# Patient Record
Sex: Female | Born: 1960 | Race: White | Hispanic: No | Marital: Married | Smoking: Never smoker
Health system: Southern US, Community
[De-identification: ages and names within clinical notes are randomized; demographics above are authoritative.]

---

## 2007-09-08 ENCOUNTER — Ambulatory Visit: Payer: Self-pay | Admitting: Family Medicine

## 2007-10-10 ENCOUNTER — Ambulatory Visit: Payer: Self-pay | Admitting: Family Medicine

## 2008-02-11 ENCOUNTER — Ambulatory Visit: Payer: Self-pay | Admitting: Family Medicine

## 2009-02-11 IMAGING — CR DG CHEST 2V
1 series · 2 of 2 positions shown · non-contrast
Comparison: none

REASON FOR EXAM: cough with fever
COMMENTS:

PROCEDURE:     MDR - MDR CHEST PA(OR AP) AND LATERAL  - October 10, 2007  [DATE]
RESULT:     The lung fields are clear. The heart, mediastinal and osseous
structures show no acute changes. The chest appears hyperexpanded,
suspicious for a history of COPD or asthma.

[Series 1: view not recorded · 0.17mm/px · 2 of 2 slices shown]
[im 1/2]
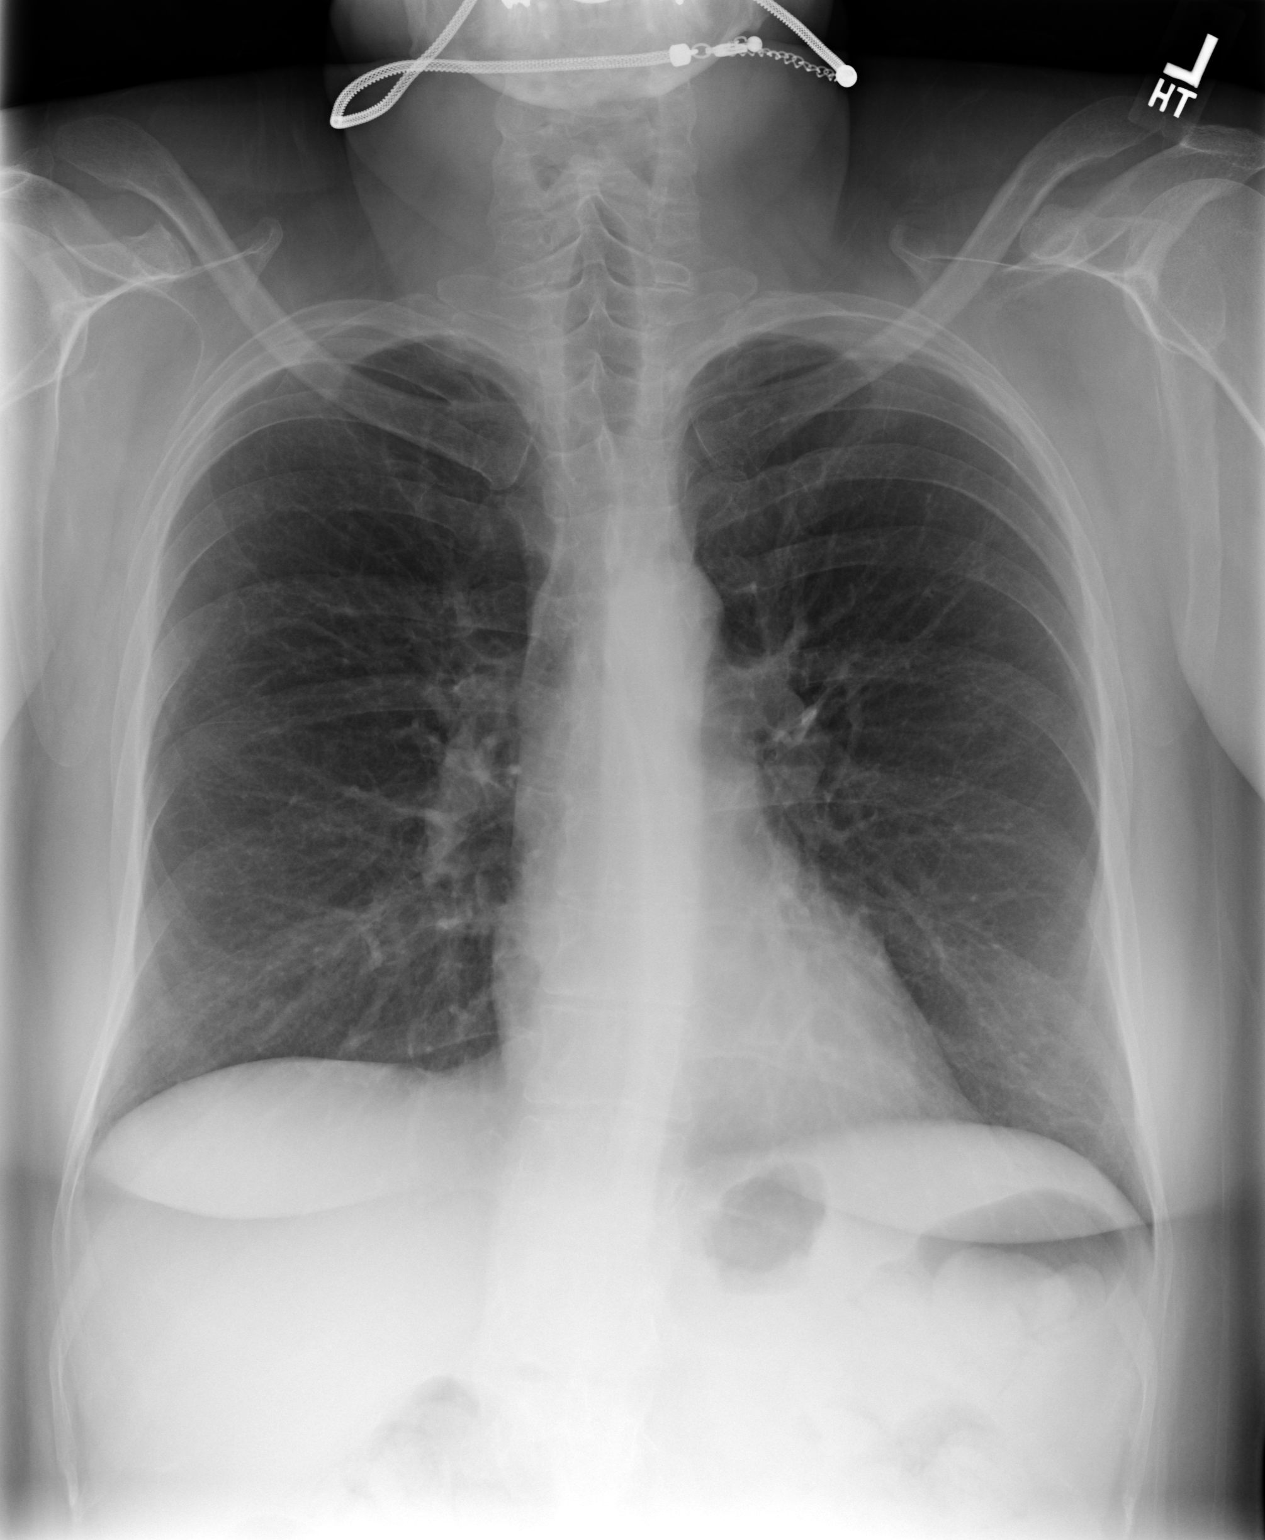
[im 2/2]
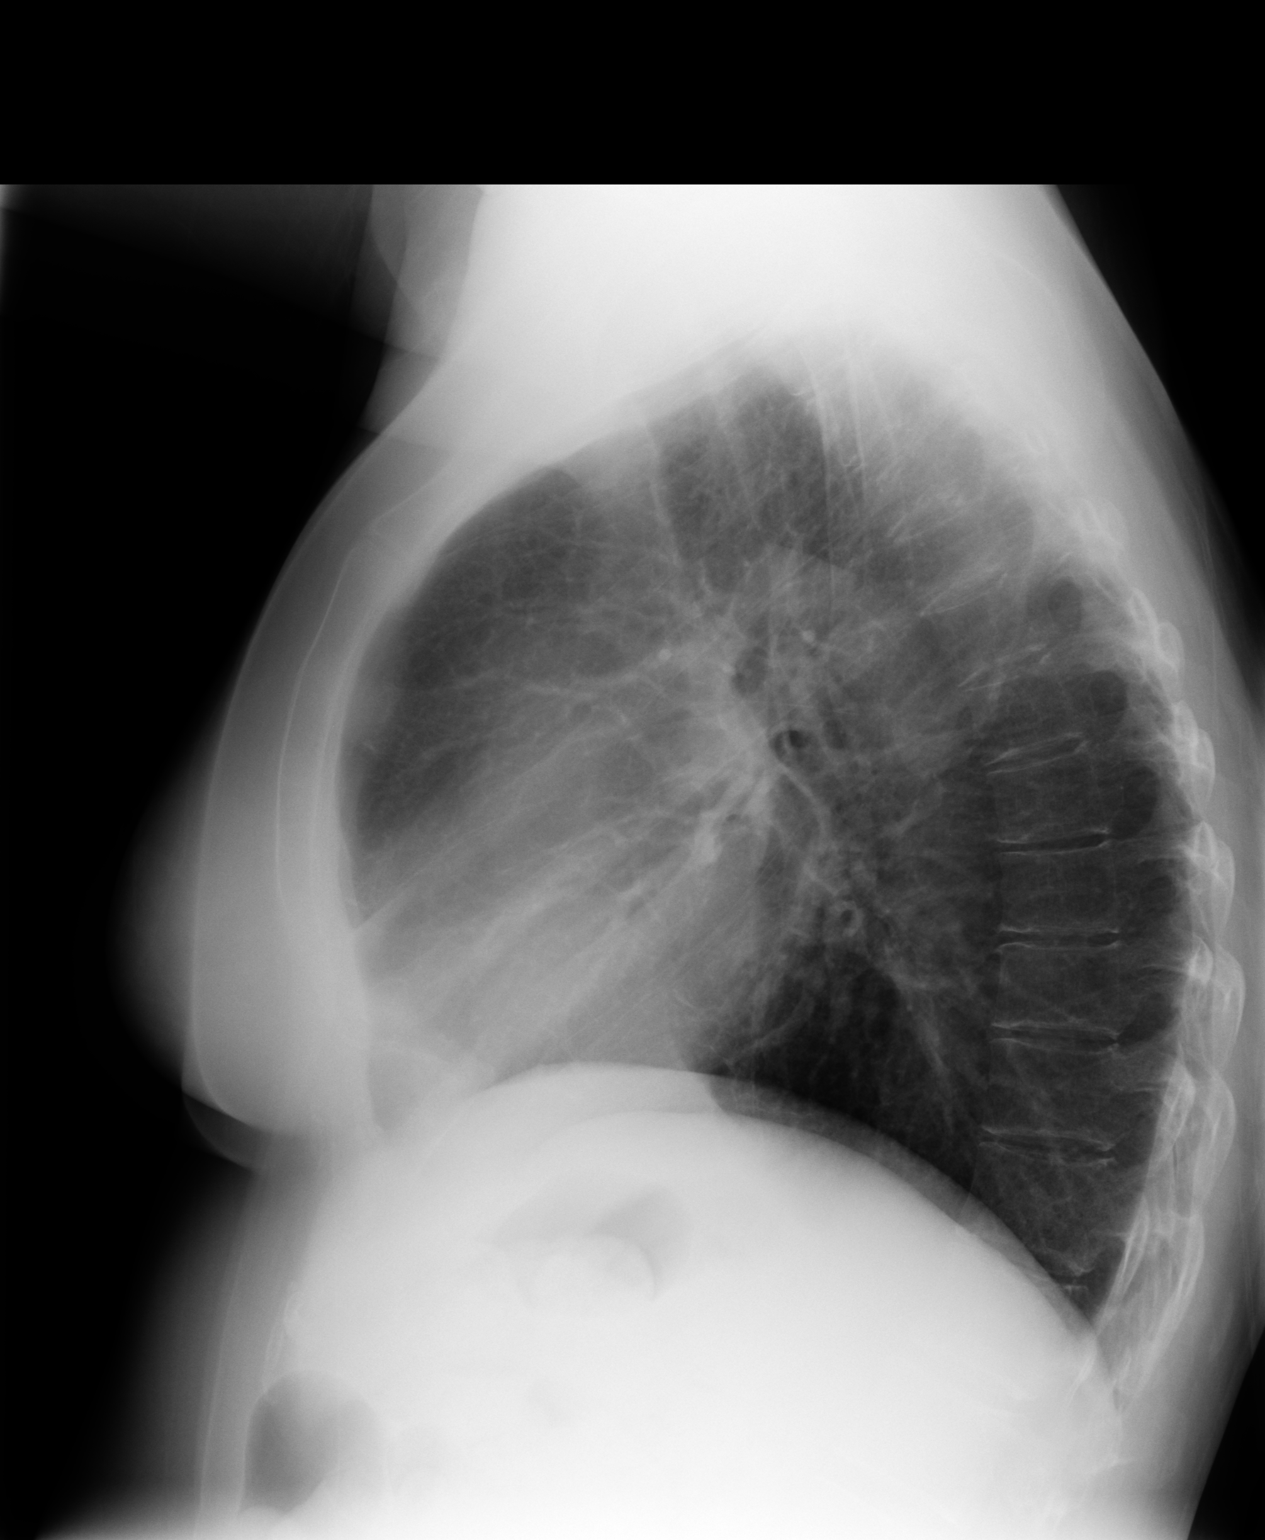

[2 of 2 positions shown; findings below may reference images not displayed]

IMPRESSION: 1. The lung fields are clear.
2. The chest appears mildly hyperexpanded.

## 2009-06-15 IMAGING — CT CT ABDOMEN W/ CM
1 of 4 series · 13 of 32 positions shown, 19 images · non-contrast
Comparison: none

REASON FOR EXAM: fatty infiltration of liver
COMMENTS:

[Series 2: soft tissue · axial · 0.69mm/px · z∈[-292,-66]mm · 13 of 53 slices shown, 19 images]
[im 4/53  soft-tissue]
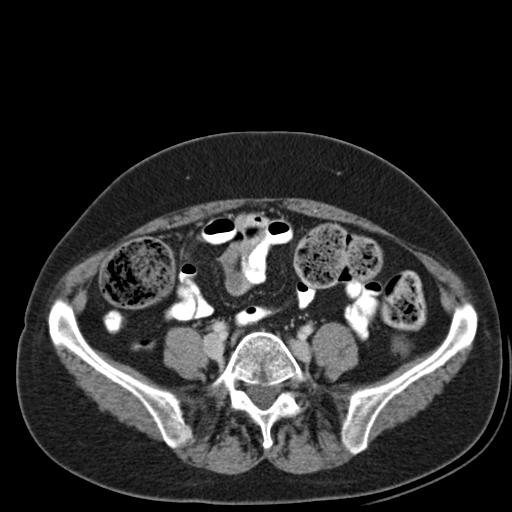
[im 4/53  bone]
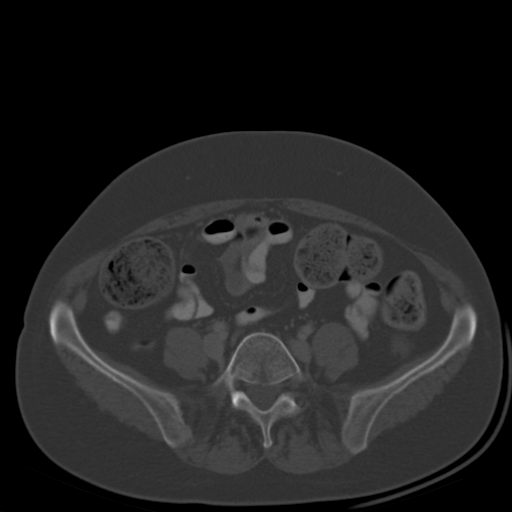
[im 8/53  soft-tissue]
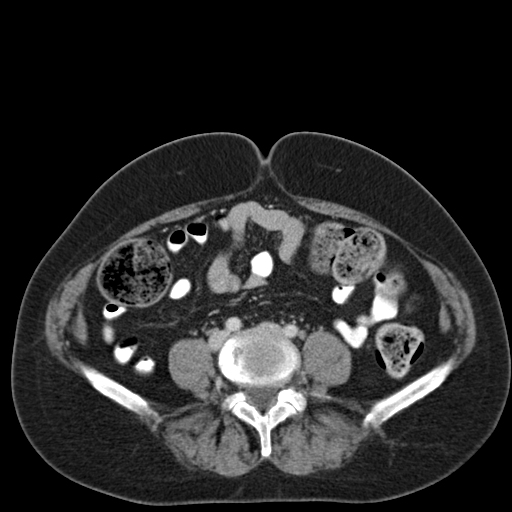
[im 12/53  soft-tissue]
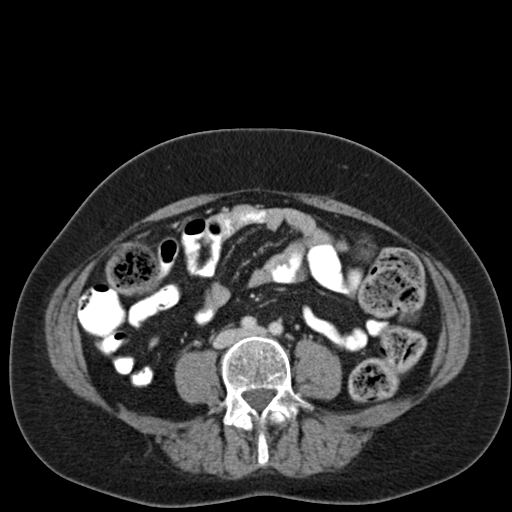
[im 15/53  soft-tissue]
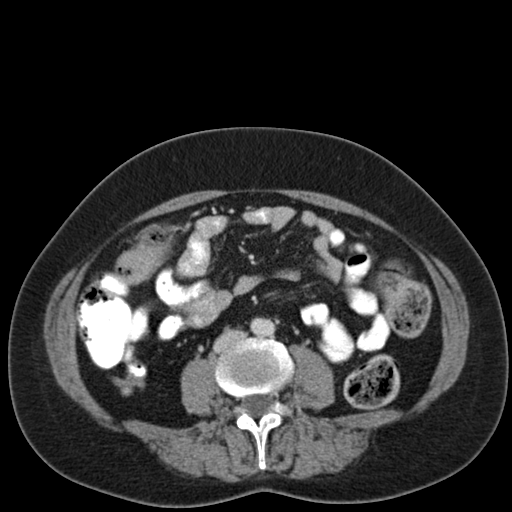
[im 19/53  soft-tissue]
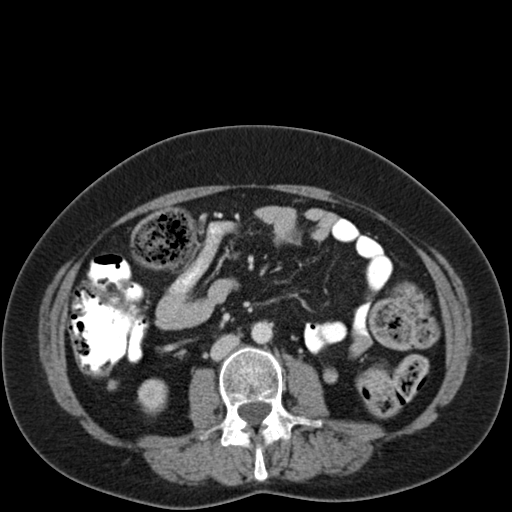
[im 23/53  soft-tissue]
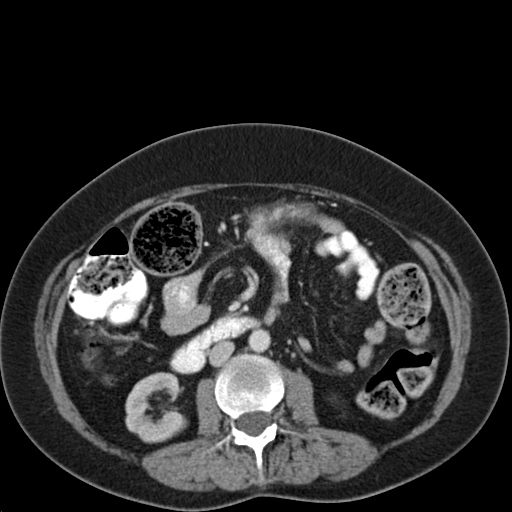
[im 27/53  soft-tissue]
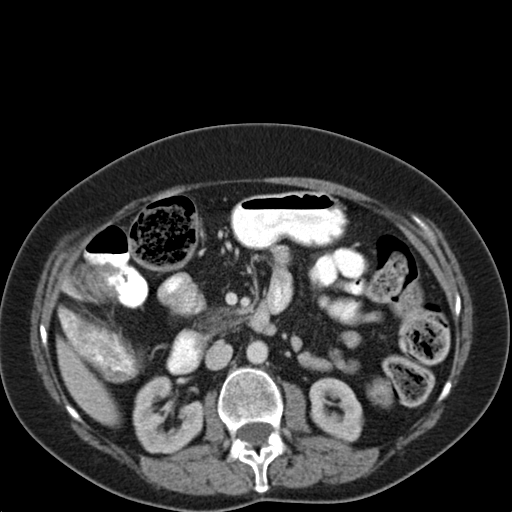
[im 30/53  soft-tissue]
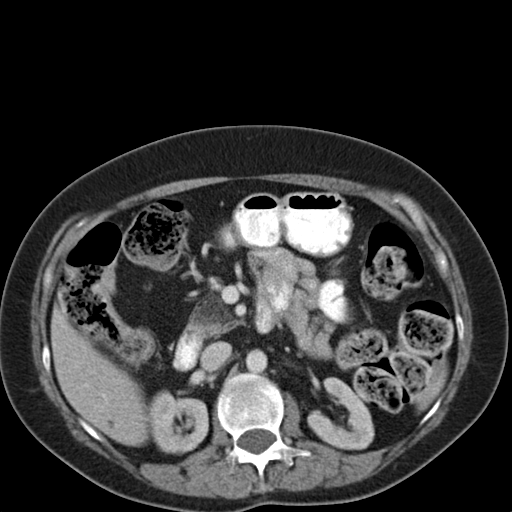
[im 34/53  soft-tissue]
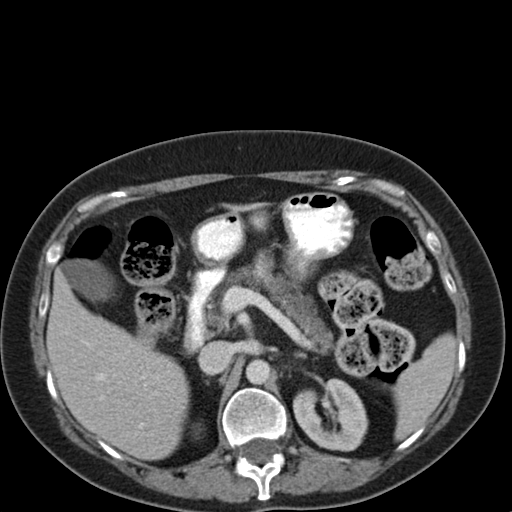
[im 34/53  bone]
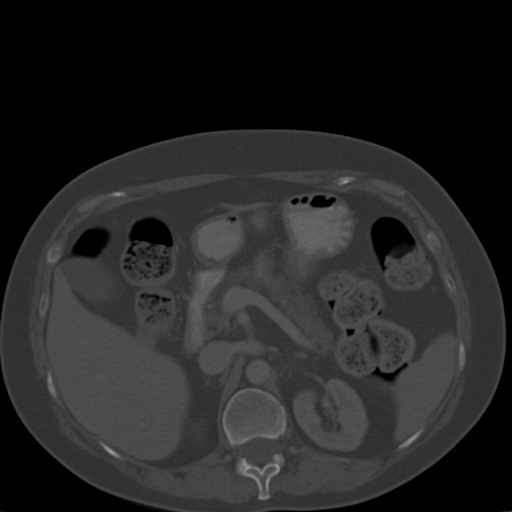
[im 38/53  soft-tissue]
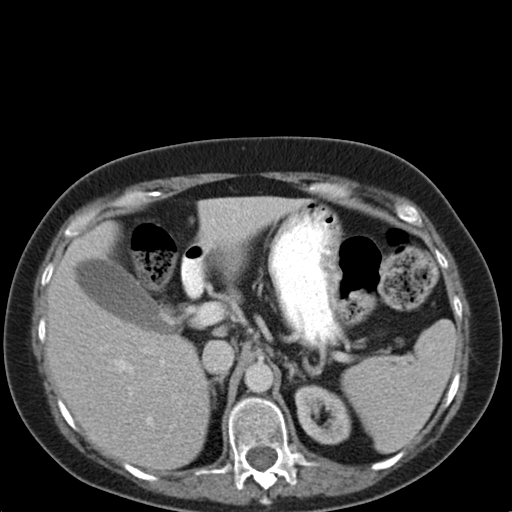
[im 38/53  lung]
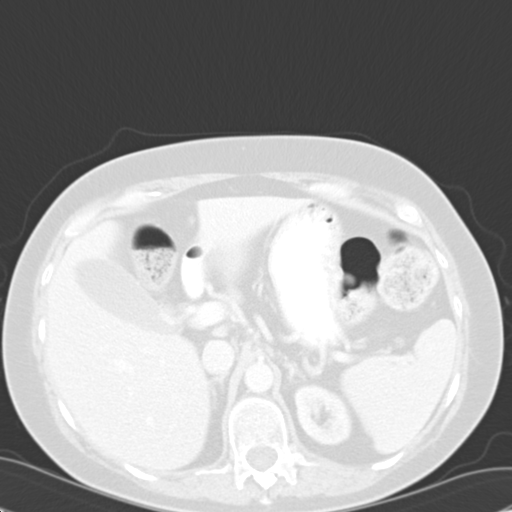
[im 41/53  soft-tissue]
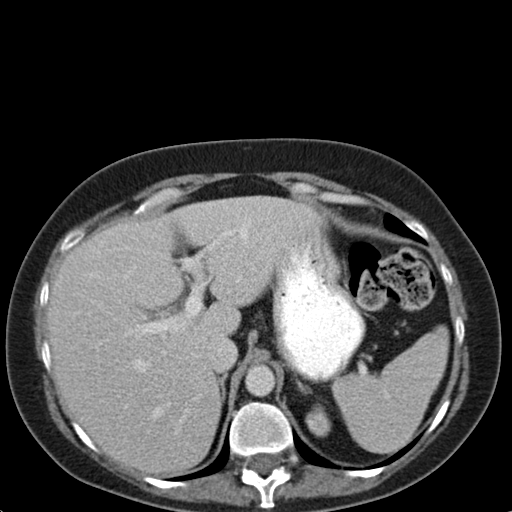
[im 41/53  lung]
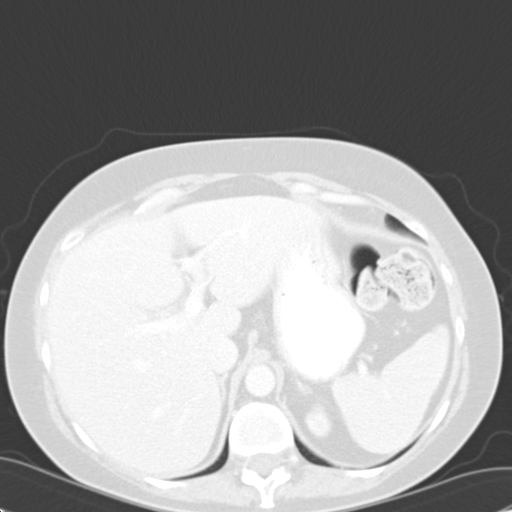
[im 45/53  soft-tissue]
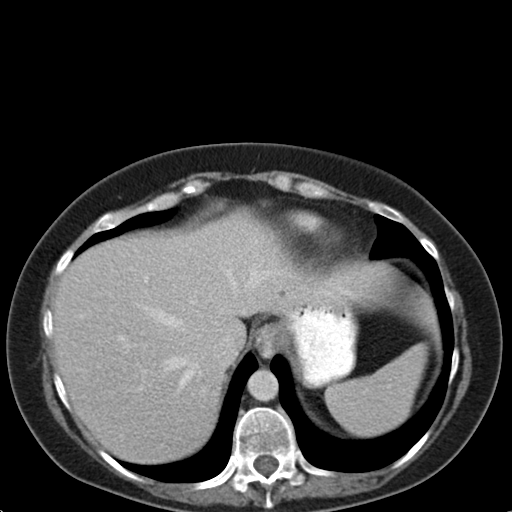
[im 45/53  lung]
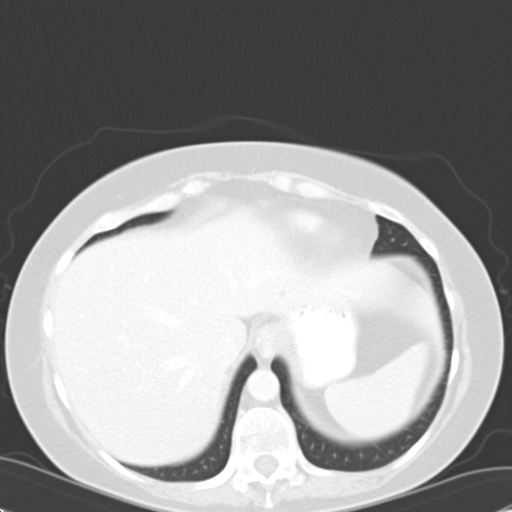
[im 49/53  soft-tissue]
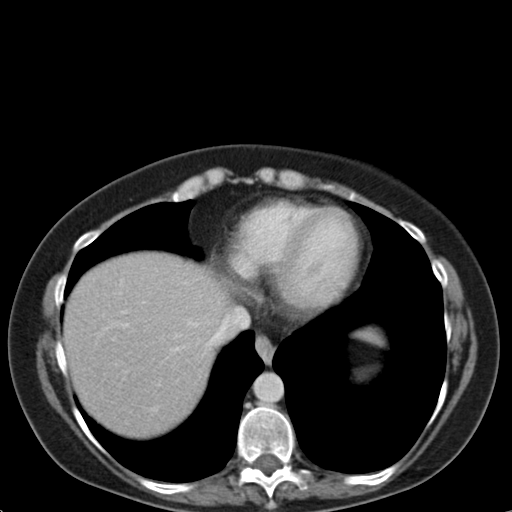
[im 49/53  lung]
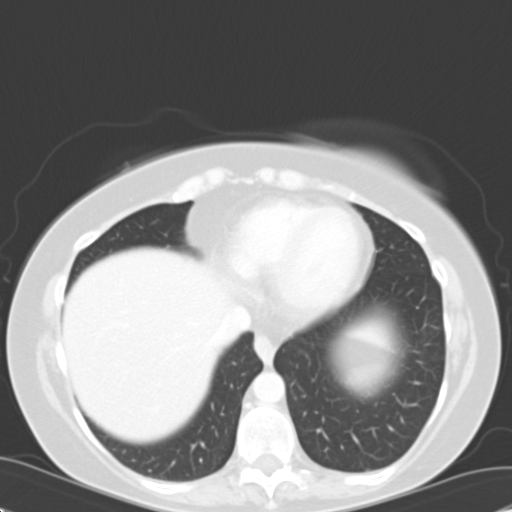

[13 of 32 positions shown; findings below may reference images not displayed]

PROCEDURE:     CT  - CT ABDOMEN STANDARD W  - February 11, 2008  [DATE]

RESULT:     CT of the abdomen is performed utilizing oral contrast and
approximately 80 ml of [id number] iodinated intravenous contrast. There is
no previous exam for comparison.

There does not appear to be evidence of fatty infiltration of the liver. The
gallbladder is unremarkable. The spleen is within normal limits. The adrenal
glands and kidneys appear normal. The abdominal aorta is unremarkable. There
is no abnormal bowel distention or bowel wall thickening.

The pancreatic head region shows decreased attenuation within the pancreas
with ill-defined margins suggestive of pancreatitis. Clinical and laboratory
correlation is recommended. A discrete mass is not evident. Images through
the base of the lungs show no gross abnormality.
IMPRESSION: Findings suggestive of pancreatitis predominantly in the
pancreatic head region. Clinical and laboratory correlation and followup is
recommended.

## 2009-12-01 ENCOUNTER — Ambulatory Visit: Payer: Self-pay | Admitting: Internal Medicine

## 2011-04-05 IMAGING — CR DG KNEE COMPLETE 4+V*R*
1 series · 4 of 4 positions shown · non-contrast
Comparison: none

REASON FOR EXAM: pain swelling
COMMENTS:   LMP: Post-Menopausal

PROCEDURE:     MDR - MDR KNEE RT COMPLETE W/OBLIQUES  - December 01, 2009  [DATE]
RESULT:     Comparison:  None

[Series 1: view not recorded · 0.17mm/px · 4 of 4 slices shown]
[im 1/4]
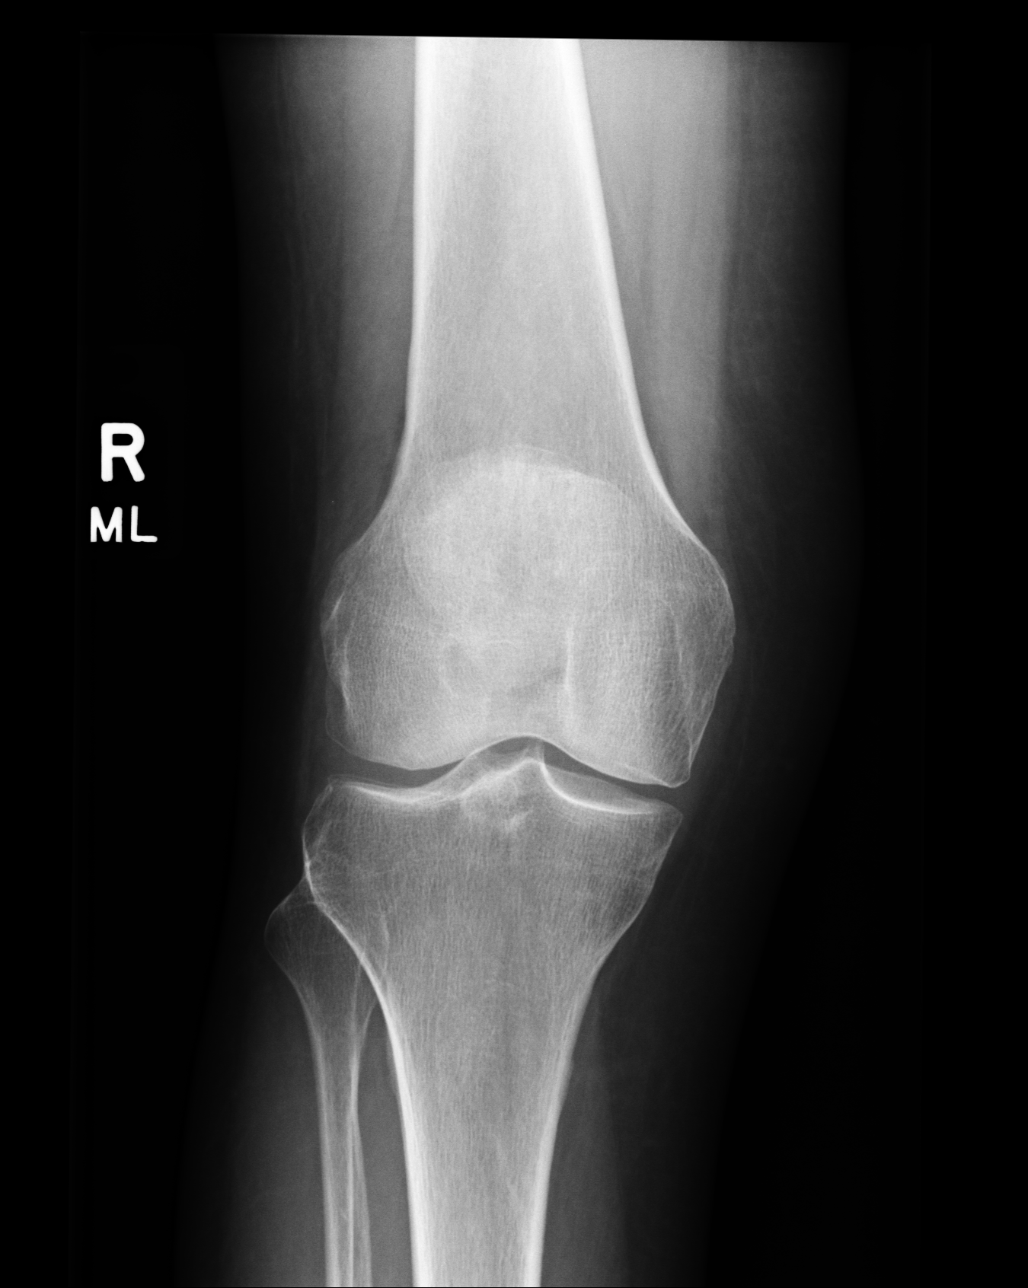
[im 2/4]
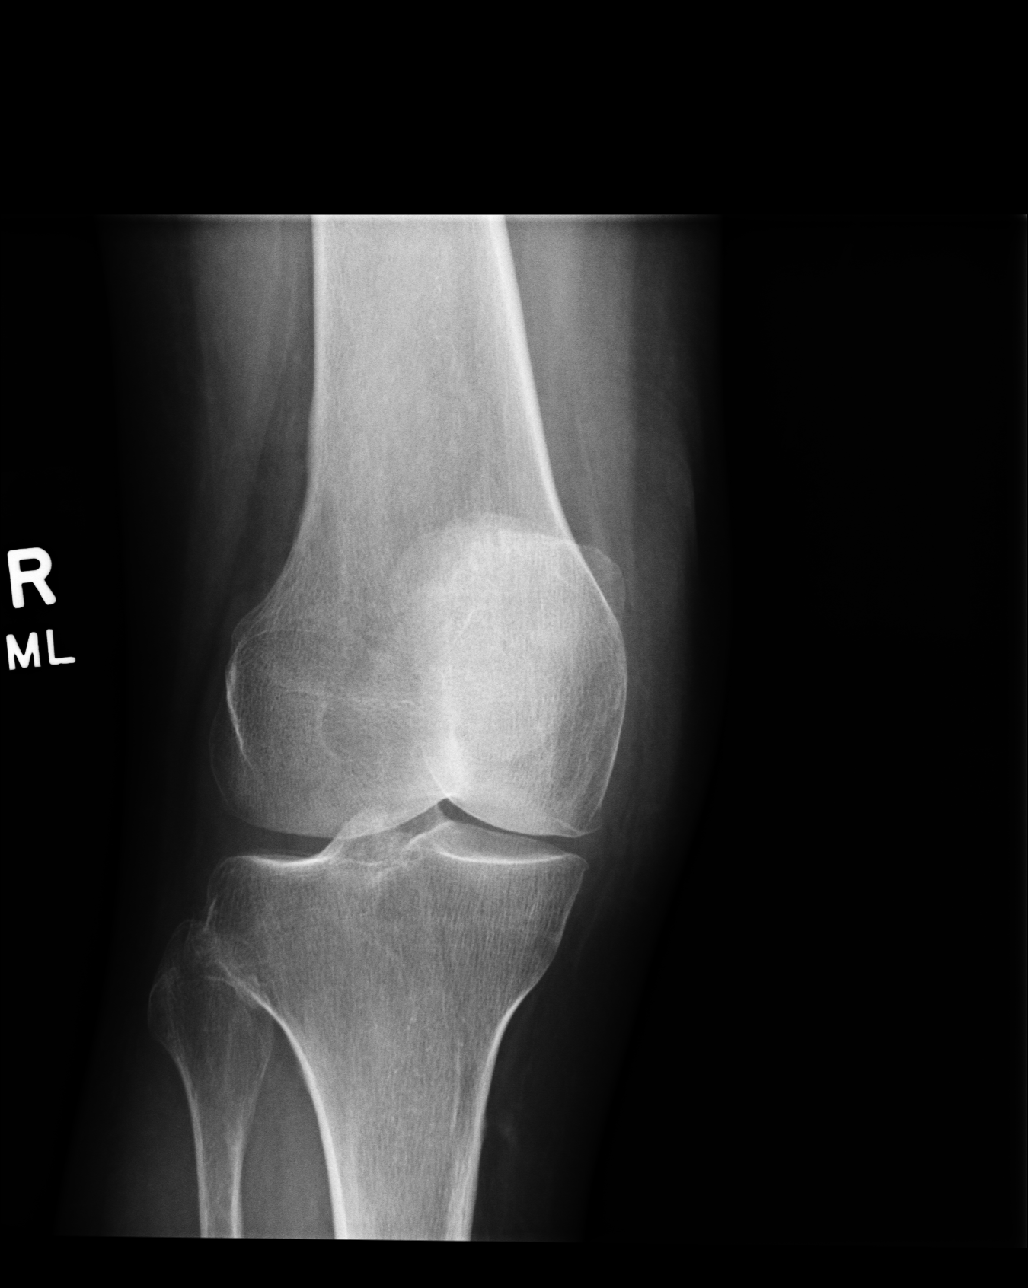
[im 3/4]
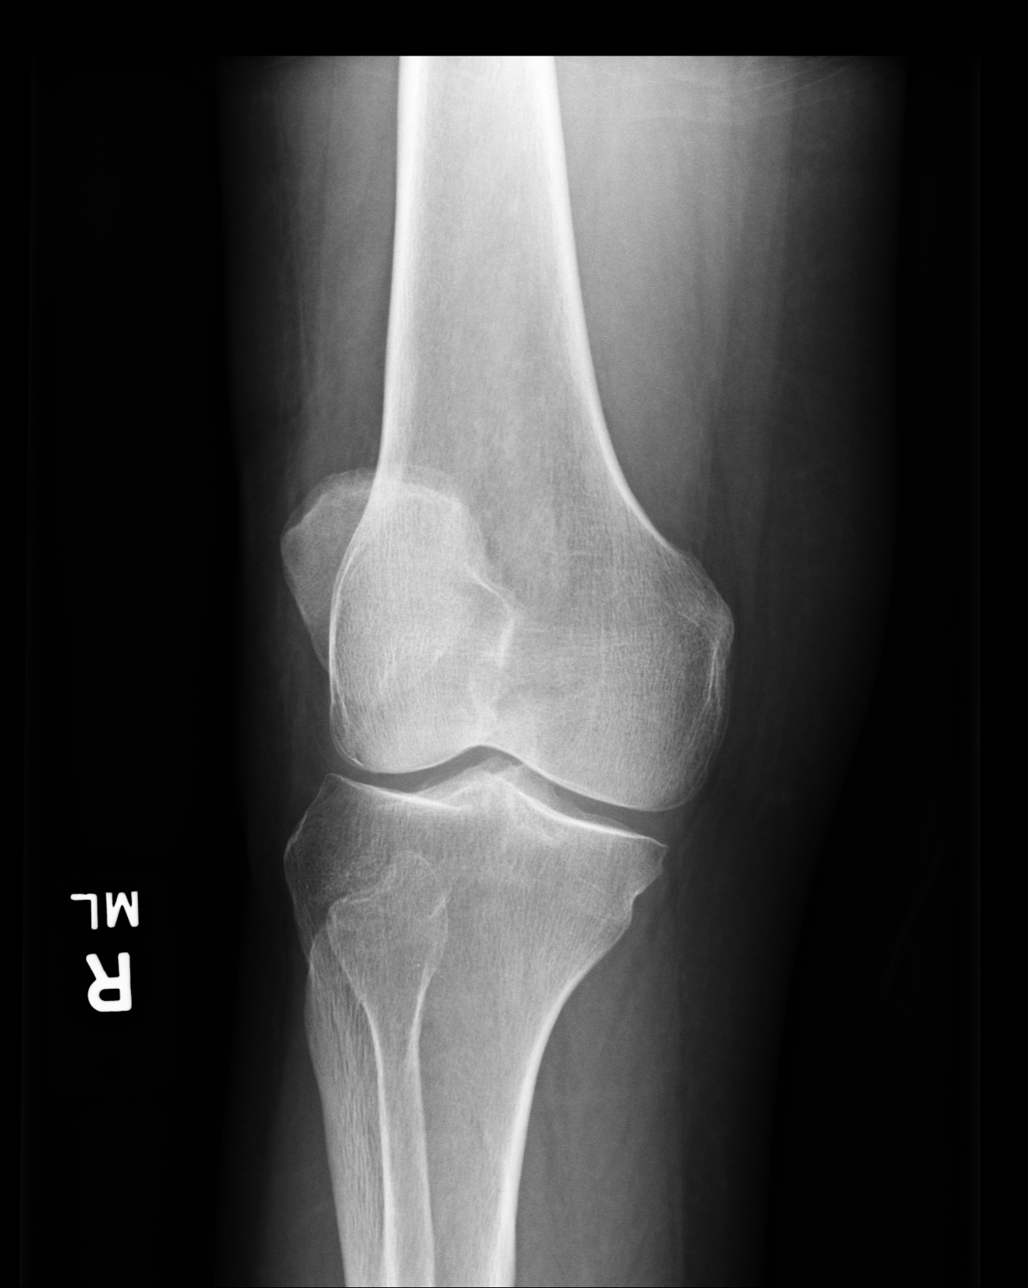
[im 4/4]
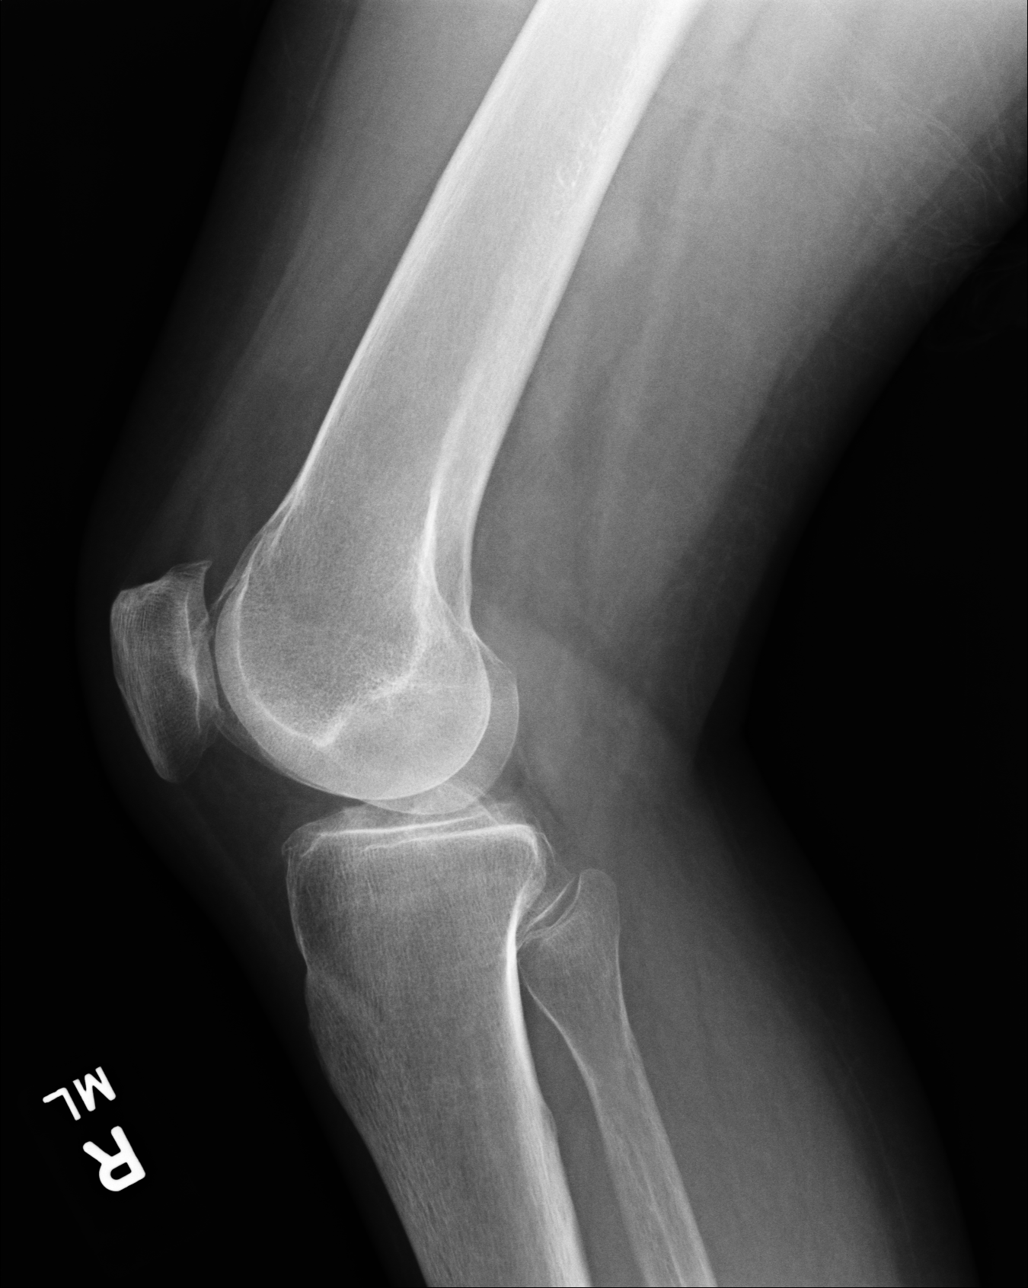

[4 of 4 positions shown; findings below may reference images not displayed]

FINDINGS: 4 views of the right knee demonstrates no acute fracture or dislocation.
There is no significant joint effusion.
IMPRESSION: No acute osseous injury of the right knee.

## 2016-11-22 ENCOUNTER — Other Ambulatory Visit: Payer: Self-pay | Admitting: Hematology and Oncology

## 2016-11-22 DIAGNOSIS — Z1239 Encounter for other screening for malignant neoplasm of breast: Secondary | ICD-10-CM

## 2017-02-03 ENCOUNTER — Encounter: Payer: Self-pay | Admitting: Emergency Medicine

## 2017-02-03 DIAGNOSIS — Z880 Allergy status to penicillin: Secondary | ICD-10-CM | POA: Insufficient documentation

## 2017-02-03 DIAGNOSIS — E860 Dehydration: Secondary | ICD-10-CM | POA: Insufficient documentation

## 2017-02-03 DIAGNOSIS — Z79899 Other long term (current) drug therapy: Secondary | ICD-10-CM | POA: Insufficient documentation

## 2017-02-03 DIAGNOSIS — N390 Urinary tract infection, site not specified: Secondary | ICD-10-CM | POA: Insufficient documentation

## 2017-02-03 DIAGNOSIS — K8000 Calculus of gallbladder with acute cholecystitis without obstruction: Principal | ICD-10-CM | POA: Insufficient documentation

## 2017-02-03 DIAGNOSIS — R112 Nausea with vomiting, unspecified: Secondary | ICD-10-CM | POA: Insufficient documentation

## 2017-02-03 DIAGNOSIS — R1011 Right upper quadrant pain: Secondary | ICD-10-CM | POA: Insufficient documentation

## 2017-02-03 LAB — COMPREHENSIVE METABOLIC PANEL
ALK PHOS: 121 U/L (ref 38–126)
ALT: 32 U/L (ref 14–54)
ANION GAP: 10 (ref 5–15)
AST: 97 U/L — ABNORMAL HIGH (ref 15–41)
Albumin: 4.4 g/dL (ref 3.5–5.0)
BUN: 12 mg/dL (ref 6–20)
CALCIUM: 9.2 mg/dL (ref 8.9–10.3)
CHLORIDE: 101 mmol/L (ref 101–111)
CO2: 30 mmol/L (ref 22–32)
Creatinine, Ser: 1.19 mg/dL — ABNORMAL HIGH (ref 0.44–1.00)
GFR calc non Af Amer: 50 mL/min — ABNORMAL LOW (ref >60)
GFR, EST AFRICAN AMERICAN: 58 mL/min — AB (ref >60)
Glucose, Bld: 140 mg/dL — ABNORMAL HIGH (ref 65–99)
Potassium: 2.8 mmol/L — ABNORMAL LOW (ref 3.5–5.1)
SODIUM: 141 mmol/L (ref 135–145)
Total Bilirubin: 0.4 mg/dL (ref 0.3–1.2)
Total Protein: 8.3 g/dL — ABNORMAL HIGH (ref 6.5–8.1)

## 2017-02-03 LAB — CBC
HCT: 43.4 % (ref 35.0–47.0)
HEMOGLOBIN: 14.1 g/dL (ref 12.0–16.0)
MCH: 27.8 pg (ref 26.0–34.0)
MCHC: 32.5 g/dL (ref 32.0–36.0)
MCV: 85.7 fL (ref 80.0–100.0)
Platelets: 310 10*3/uL (ref 150–440)
RBC: 5.06 MIL/uL (ref 3.80–5.20)
RDW: 19 % — ABNORMAL HIGH (ref 11.5–14.5)
WBC: 12.9 10*3/uL — AB (ref 3.6–11.0)

## 2017-02-03 LAB — LIPASE, BLOOD: LIPASE: 21 U/L (ref 11–51)

## 2017-02-03 MED ORDER — FENTANYL CITRATE (PF) 100 MCG/2ML IJ SOLN
50.0000 ug | INTRAMUSCULAR | Status: DC | PRN
  Administered 2017-02-03: 50 ug via NASAL
  Filled 2017-02-03: qty 2

## 2017-02-03 MED ORDER — ONDANSETRON 4 MG PO TBDP
4.0000 mg | ORAL_TABLET | Freq: Once | ORAL | Status: AC | PRN
  Administered 2017-02-03: 4 mg via ORAL
  Filled 2017-02-03: qty 1

## 2017-02-03 NOTE — ED Triage Notes (Signed)
Patient reports right upper quad pain with nausea and vomiting that started this evening.

## 2017-02-03 NOTE — ED Notes (Signed)
Pt presents with c/o N/V and abd pain after eating dinner out; offered wheelchair which pt has declined

## 2017-02-04 ENCOUNTER — Emergency Department: Payer: Self-pay

## 2017-02-04 ENCOUNTER — Observation Stay
Admission: EM | Admit: 2017-02-04 | Discharge: 2017-02-04 | Disposition: A | Discharge status: Home / Self Care | Payer: BC Managed Care – PPO | Attending: Surgery | Admitting: Surgery

## 2017-02-04 DIAGNOSIS — K8020 Calculus of gallbladder without cholecystitis without obstruction: Secondary | ICD-10-CM

## 2017-02-04 DIAGNOSIS — N390 Urinary tract infection, site not specified: Secondary | ICD-10-CM

## 2017-02-04 DIAGNOSIS — K8000 Calculus of gallbladder with acute cholecystitis without obstruction: Secondary | ICD-10-CM

## 2017-02-04 DIAGNOSIS — R1110 Vomiting, unspecified: Secondary | ICD-10-CM

## 2017-02-04 DIAGNOSIS — E876 Hypokalemia: Secondary | ICD-10-CM

## 2017-02-04 DIAGNOSIS — R319 Hematuria, unspecified: Secondary | ICD-10-CM

## 2017-02-04 DIAGNOSIS — N179 Acute kidney failure, unspecified: Secondary | ICD-10-CM

## 2017-02-04 DIAGNOSIS — R1011 Right upper quadrant pain: Secondary | ICD-10-CM

## 2017-02-04 LAB — URINALYSIS, COMPLETE (UACMP) WITH MICROSCOPIC
Bilirubin Urine: NEGATIVE
GLUCOSE, UA: NEGATIVE mg/dL
KETONES UR: 5 mg/dL — AB
NITRITE: NEGATIVE
PROTEIN: 30 mg/dL — AB
Specific Gravity, Urine: 1.027 (ref 1.005–1.030)
pH: 5 (ref 5.0–8.0)

## 2017-02-04 MED ORDER — POLYETHYLENE GLYCOL 3350 17 G PO PACK: 17.0000 g | PACK | Freq: Every day | ORAL | Status: DC | PRN

## 2017-02-04 MED ORDER — LACTATED RINGERS IV SOLN: 125.0000 mL/h | INTRAVENOUS | Status: DC

## 2017-02-04 MED ORDER — ONDANSETRON 4 MG PO TBDP: 4.0000 mg | ORAL_TABLET | Freq: Four times a day (QID) | ORAL | Status: DC | PRN

## 2017-02-04 MED ORDER — MORPHINE SULFATE (PF) 4 MG/ML IV SOLN
4.0000 mg | Freq: Once | INTRAVENOUS | Status: AC
  Administered 2017-02-04: 4 mg via INTRAVENOUS
  Filled 2017-02-04: qty 1

## 2017-02-04 MED ORDER — MORPHINE SULFATE (PF) 2 MG/ML IV SOLN
INTRAVENOUS | Status: AC
  Administered 2017-02-04: 2 mg via INTRAVENOUS
  Filled 2017-02-04: qty 1

## 2017-02-04 MED ORDER — CIPROFLOXACIN IN D5W 400 MG/200ML IV SOLN
400.0000 mg | Freq: Two times a day (BID) | INTRAVENOUS | Status: DC
Start: 2017-02-04 — End: 2017-02-04

## 2017-02-04 MED ORDER — ONDANSETRON HCL 4 MG/2ML IJ SOLN
4.0000 mg | Freq: Once | INTRAMUSCULAR | Status: AC
  Administered 2017-02-04: 4 mg via INTRAVENOUS

## 2017-02-04 MED ORDER — PANTOPRAZOLE SODIUM 40 MG IV SOLR: 40.0000 mg | Freq: Every day | INTRAVENOUS | Status: DC

## 2017-02-04 MED ORDER — MORPHINE SULFATE (PF) 2 MG/ML IV SOLN
2.0000 mg | Freq: Once | INTRAVENOUS | Status: AC
  Administered 2017-02-04: 2 mg via INTRAVENOUS

## 2017-02-04 MED ORDER — SODIUM CHLORIDE 0.9 % IV BOLUS (SEPSIS)
1000.0000 mL | Freq: Once | INTRAVENOUS | Status: AC
  Administered 2017-02-04: 1000 mL via INTRAVENOUS

## 2017-02-04 MED ORDER — HYDROCODONE-ACETAMINOPHEN 5-325 MG PO TABS
1.0000 | ORAL_TABLET | Freq: Four times a day (QID) | ORAL | 0 refills | Status: DC | PRN
Start: 2017-02-04 — End: 2020-07-31

## 2017-02-04 MED ORDER — ONDANSETRON HCL 4 MG/2ML IJ SOLN: 4.0000 mg | Freq: Four times a day (QID) | INTRAMUSCULAR | Status: DC | PRN

## 2017-02-04 MED ORDER — POTASSIUM CHLORIDE 10 MEQ/100ML IV SOLN
10.0000 meq | INTRAVENOUS | Status: DC
  Filled 2017-02-04 (×4): qty 100

## 2017-02-04 MED ORDER — ENOXAPARIN SODIUM 40 MG/0.4ML ~~LOC~~ SOLN: 40.0000 mg | SUBCUTANEOUS | Status: DC

## 2017-02-04 MED ORDER — ONDANSETRON HCL 4 MG/2ML IJ SOLN
INTRAMUSCULAR | Status: AC
  Filled 2017-02-04: qty 2

## 2017-02-04 MED ORDER — ONDANSETRON HCL 4 MG PO TABS: 4.0000 mg | ORAL_TABLET | Freq: Three times a day (TID) | ORAL | 0 refills | Status: AC | PRN

## 2017-02-04 MED ORDER — KETOROLAC TROMETHAMINE 30 MG/ML IJ SOLN: 30.0000 mg | Freq: Four times a day (QID) | INTRAMUSCULAR | Status: DC

## 2017-02-04 MED ORDER — CIPROFLOXACIN HCL 500 MG PO TABS
500.0000 mg | ORAL_TABLET | Freq: Once | ORAL | Status: AC
  Administered 2017-02-04: 500 mg via ORAL
  Filled 2017-02-04: qty 1

## 2017-02-04 MED ORDER — CIPROFLOXACIN HCL 500 MG PO TABS: 500.0000 mg | ORAL_TABLET | Freq: Two times a day (BID) | ORAL | 0 refills | Status: DC

## 2017-02-04 MED ORDER — HYDROMORPHONE HCL 1 MG/ML IJ SOLN: 0.5000 mg | INTRAMUSCULAR | Status: DC | PRN

## 2017-02-04 MED ORDER — KETOROLAC TROMETHAMINE 30 MG/ML IJ SOLN
30.0000 mg | Freq: Once | INTRAMUSCULAR | Status: AC
  Administered 2017-02-04: 30 mg via INTRAVENOUS
  Filled 2017-02-04: qty 1

## 2017-02-04 MED ORDER — POTASSIUM CHLORIDE CRYS ER 20 MEQ PO TBCR
40.0000 meq | EXTENDED_RELEASE_TABLET | Freq: Once | ORAL | Status: AC
  Administered 2017-02-04: 40 meq via ORAL
  Filled 2017-02-04: qty 2

## 2017-02-04 NOTE — ED Provider Notes (Signed)
[facility] Emergency Department Provider Note   First MD Initiated Contact with Patient 02/04/17 0141     (approximate)  I have reviewed the triage vital signs and the nursing notes.   HISTORY  Chief Complaint Abdominal Pain and Emesis   HPI Regina Hanson is a 56 y.o. female presents emergency department with acute onset of right upper quadrant abdominal pain associated with nausea and vomiting with onset tonight after eating ice cream. Patient states current pain score is 8 out of 10 but at maximum intensity was 10 out of 10. Patient continues to admit to nausea. Patient denies any fever or febrile on presentation temperature 98.1.   Past medical history None There are no active problems to display for this patient.   Past surgical history None  Prior to Admission medications   Medication Sig Start Date End Date Taking? Authorizing Provider  cetirizine (ZYRTEC) 10 MG tablet Take 10 mg by mouth daily. 02/23/16 02/17/17 Yes [provider]  cyclobenzaprine (FLEXERIL) 5 MG tablet Take 1 tablet by mouth 4 (four) times daily as needed. 11/16/16  Yes [provider]  lansoprazole (PREVACID) 30 MG capsule Take 30 mg by mouth daily. 08/24/16  Yes [provider]  methadone (METHADOSE) 40 MG disintegrating tablet Take 63 mg by mouth every 6 (six) hours as needed.   Yes [provider]  Multiple Vitamin (MULTIVITAMIN) capsule Take 1 capsule by mouth daily.   Yes [provider]  potassium chloride (K-DUR) 10 MEQ tablet Take 10 mEq by mouth daily. 02/14/16  Yes [provider]  torsemide (DEMADEX) 10 MG tablet Take 10 mg by mouth daily as needed. 05/17/16  Yes [provider]    Allergies Amoxicillin  No family history on file.  Social History Social History  Substance Use Topics  . Smoking status: Not on file  . Smokeless tobacco: Not on file  . Alcohol use Not on file    Review of  Systems Constitutional: No fever/chills Eyes: No visual changes. ENT: No sore throat. Cardiovascular: Denies chest pain. Respiratory: Denies shortness of breath. Gastrointestinal: Positive right upper quadrant pain and vomiting  Genitourinary: Negative for dysuria. Musculoskeletal: Negative for neck pain.  Negative for back pain. Integumentary: Negative for rash. Neurological: Negative for headaches, focal weakness or numbness.   ____________________________________________   PHYSICAL EXAM:  VITAL SIGNS: ED Triage Vitals  Enc Vitals Group     BP 02/03/17 2203 (!) 153/76     Pulse Rate 02/03/17 2203 100     Resp 02/03/17 2203 16     Temp 02/03/17 2203 98.1 F (36.7 C)     Temp Source 02/03/17 2203 Oral     SpO2 02/03/17 2203 100 %     Weight 02/03/17 2202 65.8 kg (145 lb)     Height 02/03/17 2202 1.702 m (5\' 7" )     Head Circumference --      Peak Flow --      Pain Score 02/03/17 2208 9     Pain Loc --      Pain Edu? --      Excl. in GC? --     Constitutional: Alert and oriented. Apparent discomfort Eyes: Conjunctivae are normal.  Head: Atraumatic. Mouth/Throat: Mucous membranes are moist.  Oropharynx non-erythematous. Neck: No stridor.   Cardiovascular: Normal rate, regular rhythm. Good peripheral circulation. Grossly normal heart sounds. Respiratory: Normal respiratory effort.  No retractions. Lungs CTAB. Gastrointestinal: Right upper quadrant tenderness to palpation no distention.  Musculoskeletal: No lower extremity tenderness nor edema. No gross deformities of extremities. Neurologic:  Normal speech and language. No gross focal neurologic deficits are appreciated.  Skin:  Skin is warm, dry and intact. No rash noted. Psychiatric: Mood and affect are normal. Speech and behavior are normal.  ____________________________________________   LABS (all labs ordered are listed, but only abnormal results are displayed)  Labs Reviewed  COMPREHENSIVE METABOLIC PANEL -  Abnormal; Notable for the following:       Result Value   Potassium 2.8 (*)    Glucose, Bld 140 (*)    Creatinine, Ser 1.19 (*)    Total Protein 8.3 (*)    AST 97 (*)    GFR calc non Af Amer 50 (*)    GFR calc Af Amer 58 (*)    All other components within normal limits  CBC - Abnormal; Notable for the following:    WBC 12.9 (*)    RDW 19.0 (*)    All other components within normal limits  URINALYSIS, COMPLETE (UACMP) WITH MICROSCOPIC - Abnormal; Notable for the following:    Color, Urine AMBER (*)    APPearance HAZY (*)    Hgb urine dipstick SMALL (*)    Ketones, ur 5 (*)    Protein, ur 30 (*)    Leukocytes, UA MODERATE (*)    Bacteria, UA RARE (*)    Squamous Epithelial / LPF 0-5 (*)    All other components within normal limits  LIPASE, BLOOD   ____________________________________________  EKG  ED ECG REPORT I, [place] Kathleena Freeman, the attending physician, personally viewed and interpreted this ECG.   Date: 02/04/2017  EKG Time: 10:38 PM  Rate: 111  Rhythm: Sinus tachycardia  Axis: Normal  Intervals: Normal  ST&T Change: None  ____________________________________________  RADIOLOGY I, [place] Mccade Sullenberger, personally viewed and evaluated these images (plain radiographs) as part of my medical decision making, as well as reviewing the written report by the radiologist.  [place] Abdomen Limited Ruq  Result Date: 02/04/2017 CLINICAL DATA:  Right upper quadrant pain.  Nausea and vomiting. EXAM: ULTRASOUND ABDOMEN LIMITED RIGHT UPPER QUADRANT COMPARISON:  None. FINDINGS: Gallbladder: Distended containing intraluminal stones and tumefactive sludge. Largest stone measures 1.5 cm. No gallbladder wall thickening, wall thickness of less than 3 mm. No pericholecystic fluid. No sonographic Murphy sign noted by sonographer. Common bile duct: Diameter: 9 mm. Liver: 7 mm cyst in the left hepatic lobe. No suspicious hepatic lesion. Mild heterogeneous hepatic echogenicity. Normal directional flow  in the main portal vein. IMPRESSION: 1. Distended gallbladder with stones and tumefactive sludge. No wall thickening or pericholecystic fluid to suggest acute cholecystitis. 2. Common bile duct is dilated measuring 9 mm. No choledocholithiasis is seen sonographically. Consider MRCP or nuclear medicine hepatobiliary scan (HIDA) for further evaluation. Electronically Signed   By: Rubye OaksMelanie  Ehinger M.D.   On: 02/04/2017 05:16      Procedures   ____________________________________________   INITIAL IMPRESSION / ASSESSMENT AND PLAN / ED COURSE  Pertinent labs & imaging results that were available during my care of the patient were reviewed by me and considered in my medical decision making (see chart for details).  56 year old female presenting to the emergency department with history of physical exam consistent with cholelithiasis versus cholecystitis. Patient given 2 doses of IV morphine in the emergency department 4 mg each with improvement of pain however current pain score still "5 out of 10". Concern for possible cholecystitis as such patient discussed with Dr. Aleen CampiPiscoya for  hospital admission for further evaluation and management.      ____________________________________________  FINAL CLINICAL IMPRESSION(S) / ED DIAGNOSES  Final diagnoses:  Vomiting  RUQ pain  Calculus of gallbladder with acute cholecystitis without obstruction     MEDICATIONS GIVEN DURING THIS VISIT:  Medications  fentaNYL (SUBLIMAZE) injection 50 mcg (50 mcg Nasal Given 02/03/17 2213)  ondansetron (ZOFRAN-ODT) disintegrating tablet 4 mg (4 mg Oral Given 02/03/17 2213)  morphine 2 MG/ML injection 2 mg (2 mg Intravenous Given 02/04/17 0212)  ondansetron (ZOFRAN) injection 4 mg (4 mg Intravenous Given 02/04/17 0212)  morphine 2 MG/ML injection 2 mg (2 mg Intravenous Given 02/04/17 0528)     NEW OUTPATIENT MEDICATIONS STARTED DURING THIS VISIT:  New Prescriptions   No medications on file    Modified  Medications   No medications on file    Discontinued Medications   No medications on file     Note:  This document was prepared using Dragon voice recognition software and may include unintentional dictation errors.    Darci Current, MD 02/04/17 [phone]

## 2017-02-04 NOTE — ED Notes (Signed)
Patient attempting to void 

## 2017-02-04 NOTE — Consult Note (Signed)
SURGICAL CONSULTATION NOTE (initial) - cpt: [phone]  HISTORY OF PRESENT ILLNESS (HPI):  56 y.o. female presented to [facility] ED with acute onset of RUQ > epigastric abdominal pain associated with nausea and subsequent emesis after eating chicken for dinner last night, followed by ice cream. Patient states she's in retrospect previously experienced similar but less severe RUQ > epigastric pain over the past 6 months to 1 year, but attributed it to "gas pains" and never though much about it. While in the ED, she states the morphine she's received has numbed and nearly eliminated the pain, but mild persistent RUQ abdominal pain "like a toothache" seems to return as the morphine "wears off". Patient last received morphine 1 hour prior to current assessment and currently denies any abdominal pain at all. Patient otherwise denies fever/chills, CP, or SOB, but does report worsened burning with urination x 2 days.  Of note, patient expresses concern that having surgery now might cause her to miss the birth of her first grandchild as her daughter is due to be induced "any day" since she is past her predicted date of anticipated childbirth. She also requests to be able to schedule her surgery following resolution of her current UTI.  Surgery is consulted by ED physicians Dr. Manson Passey and Dr. Shaune Pollack in this context for evaluation and management of symptomatic cholelithiasis.  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 No other past medical history.  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 Authorizing Provider  cetirizine (ZYRTEC) 10 MG tablet Take 10 mg by mouth daily. 02/23/16 02/17/17 Yes [provider]  cyclobenzaprine (FLEXERIL) 5 MG tablet Take 1 tablet by mouth 4 (four) times daily as  needed. 11/16/16  Yes [provider]  lansoprazole (PREVACID) 30 MG capsule Take 30 mg by mouth daily. 08/24/16  Yes [provider]  methadone (DOLOPHINE) 10 MG/5ML solution Take 63 mg by mouth daily.    Yes [provider]  Multiple Vitamin (MULTIVITAMIN) capsule Take 1 capsule by mouth daily.    [provider]  potassium chloride (K-DUR) 10 MEQ tablet Take 10 mEq by mouth daily. 02/14/16   [provider]  torsemide (DEMADEX) 10 MG tablet Take 10 mg by mouth daily as needed. 05/17/16   [provider]     ALLERGIES:  Allergies  Allergen Reactions  . Amoxicillin Other (See Comments)    Sores in mouth     SOCIAL HISTORY:  Social History   Social History  . Marital status: Married    Spouse name: N/A  . Number of children: N/A  . Years of education: N/A   Occupational History  . School teacher currently on summer break   Social History Main Topics  . Smoking status: Not on file  . Smokeless tobacco: Not on file  . Alcohol use Not on file  . Drug use: Unknown  . Sexual activity: Not on file   Other Topics Concern  . Not on file   Social History Narrative  . No narrative on file    The patient currently resides (home / rehab facility / nursing home): Home  The patient normally is (ambulatory / bedbound): Ambulatory   FAMILY HISTORY:  No family history on file.   REVIEW OF SYSTEMS:  Constitutional: denies weight loss, fever, chills, or sweats  Eyes: denies any other vision changes, history of eye injury  ENT: denies sore throat, hearing problems  Respiratory: denies shortness of breath, wheezing  Cardiovascular: denies chest pain, palpitations  Gastrointestinal: abdominal pain, N/V, and bowel function as per HPI Genitourinary: denies burning with urination or urinary frequency Musculoskeletal: denies any other joint pains or cramps  Skin: denies any other rashes or skin discolorations  Neurological: denies any  other headache, dizziness, weakness  Psychiatric: denies any other depression, anxiety   All other review of systems were negative   VITAL SIGNS:  Temp:  [98.1 F (36.7 C)] 98.1 F (36.7 C) (07/21 2203) Pulse Rate:  [87-100] 92 (07/22 0655) Resp:  [16-18] 18 (07/22 0655) BP: (116-153)/(70-96) 137/92 (07/22 0655) SpO2:  [96 %-100 %] 99 % (07/22 0655) Weight:  [145 lb (65.8 kg)] 145 lb (65.8 kg) (07/21 2202)     Height: 5\' 7"  (170.2 cm) Weight: 145 lb (65.8 kg) BMI (Calculated): 22.8   INTAKE/OUTPUT:  This shift: No intake/output data recorded.  Last 2 shifts: @IOLAST2SHIFTS @   PHYSICAL EXAM:  Constitutional:  -- Normal body habitus  -- Awake, alert, and oriented x3  Eyes:  -- Pupils equally round and reactive to light  -- No scleral icterus  Ear, nose, and throat:  -- No jugular venous distension  Pulmonary:  -- No crackles  -- Equal breath sounds bilaterally -- Breathing non-labored at rest Cardiovascular:  -- S1, S2 present  -- No pericardial rubs Gastrointestinal:  -- Abdomen soft and non-distended with minimal RUQ abdominal tenderness to palpation and negative Murphy's sign, no guarding/rebound  -- No abdominal masses appreciated, pulsatile or otherwise  Musculoskeletal and Integumentary:  -- Wounds or skin discoloration: None appreciated -- Extremities: B/L UE and LE FROM, hands and feet warm, no edema  Neurologic:  -- Motor function: intact and symmetric -- Sensation: intact and symmetric  Labs:  CBC Latest Ref Rng & Units 02/03/2017  WBC 3.6 - 11.0 K/uL 12.9(H)  Hemoglobin 12.0 - 16.0 g/dL 16.114.1  Hematocrit 09.635.0 - 47.0 % 43.4  Platelets 150 - 440 K/uL 310   CMP Latest Ref Rng & Units 02/03/2017  Glucose 65 - 99 mg/dL 045(W140(H)  BUN 6 - 20 mg/dL 12  Creatinine 0.980.44 - 1.191.00 mg/dL 1.47(W1.19(H)  Sodium 295135 - 621145 mmol/L 141  Potassium 3.5 - 5.1 mmol/L 2.8(L)  Chloride 101 - 111 mmol/L 101  CO2 22 - 32 mmol/L 30  Calcium 8.9 - 10.3 mg/dL 9.2  Total Protein 6.5 - 8.1  g/dL 8.3(H)  Total Bilirubin 0.3 - 1.2 mg/dL 0.4  Alkaline Phos 38 - 126 U/L 121  AST 15 - 41 U/L 97(H)  ALT 14 - 54 U/L 32    Imaging studies:  Limited RUQ Abdominal Ultrasound (02/04/2017) Distended containing intraluminal stones and tumefactive sludge. Largest stone measures 1.5 cm. No gallbladder wall thickening, wall thickness of less than 3 mm. No pericholecystic fluid. No sonographic Murphy sign noted by sonographer. Common bile duct diameter measures 9 mm.  Assessment/Plan: (ICD-10's: 49K80.20) 56 y.o. female with symptomatic cholelithiasis and mildly prominent CBD diameter with normal LFT's and no evidence to suggest acute cholecystitis, complicated by symptomatic recurrent UTI and pertinent comorbidities including remote history of narcotics abuse for which patient remains on chronic methadone therapy.   - IV fluid bolus for dehydration  - Toradol and/or fentanyl for pain if needed (avoid morphine, Dilaudid, etc for biliary colic)  - avoid/minimize foods containing more fat (meats, cheeses/dairy, and fried foods), prefer grains, vegetables, and  fruits  - patient states she'll have "no problem" maintaining recommended diet restrictions and wants to follow up instead of surgery today  - okay to follow up outpatient to reassess and likely schedule for elective laparoscopic cholecystectomy  - patient expresses understanding to call office or return to ED if symptoms recur/worsen  - complete course of antibiotics for urinary tract infection as per ED  All of the above findings and recommendations were discussed with the patient, her family, and ED physician Dr. Shaune Pollack, and all of patient's and her family's questions were answered to their expressed satisfaction.  Thank you for the opportunity to participate in this patient's care.   -- Scherrie Gerlach Earlene Plater, MD, RPVI : [facility] General Surgery - Partnering for exceptional care. Office: [phone]

## 2017-02-04 NOTE — ED Notes (Signed)
Surgeon consultation at bedside.

## 2017-02-04 NOTE — Discharge Instructions (Signed)
You were evaluated for abdominal pain and found to have evidence of gallstones as well as urinary tract infection. You're also found to have low potassium and given potassium supplement here in the emergency department. You're also found to have mild decreased kidney function, acute kidney injury, likely due to dehydration and you're given IV fluids here in the emergency department.  After discussion with Dr. Earlene Plateravis, general surgeon, he recommends following up with him as an outpatient and lesser worsening of course.  You're being treated for urinary tract infection with antibiotic called Cipro.  Return to the emergency department immediately for any worsening or uncontrolled abdominal pain, weakness, numbness, black or bloody stools, vomiting and concern for dehydration, fever, or any other symptoms concerning to you. Please follow-up with a primary care doctor for evaluation and follow-up of urinary tract infection as well as low potassium as well as dehydration.

## 2017-02-04 NOTE — ED Notes (Signed)
Patient reports no nausea after PO intake. Reports that her pain remains a 2/10 at this time. MD made aware.

## 2017-02-04 NOTE — ED Provider Notes (Signed)
[facility]  I accepted care from Dr. Manson PasseyBrown ____________________________________________    LABS (pertinent positives/negatives)  Labs Reviewed  COMPREHENSIVE METABOLIC PANEL - Abnormal; Notable for the following:       Result Value   Potassium 2.8 (*)    Glucose, Bld 140 (*)    Creatinine, Ser 1.19 (*)    Total Protein 8.3 (*)    AST 97 (*)    GFR calc non Af Amer 50 (*)    GFR calc Af Amer 58 (*)    All other components within normal limits  CBC - Abnormal; Notable for the following:    WBC 12.9 (*)    RDW 19.0 (*)    All other components within normal limits  URINALYSIS, COMPLETE (UACMP) WITH MICROSCOPIC - Abnormal; Notable for the following:    Color, Urine AMBER (*)    APPearance HAZY (*)    Hgb urine dipstick SMALL (*)    Ketones, ur 5 (*)    Protein, ur 30 (*)    Leukocytes, UA MODERATE (*)    Bacteria, UA RARE (*)    Squamous Epithelial / LPF 0-5 (*)    All other components within normal limits  URINE CULTURE  LIPASE, BLOOD      ____________________________________________   PROCEDURES  Procedure(s) performed: None  Critical Care performed: None  ____________________________________________   INITIAL IMPRESSION / ASSESSMENT AND PLAN / ED COURSE   Pertinent labs & imaging results that were available during my care of the patient were reviewed by me and considered in my medical decision making (see chart for details).  Patient was initially signed out the patient was going to likely be admitted to surgery with plans for cholecystectomy in setting of right upper quadrant pain.  I spoke with the morning general surgeon who reevaluated the patient and in discussion with the patient elected to hold off on surgery at this point and plan to manage symptoms as an outpatient, suspecting that the elevated white blood cell count was related to her dysuria and urinary tract infection and she'll be treated for that.  Here in the emergency  department due to his mild dehydration with her acute kidney injury, patient was given IV fluids, as well as a dose of potassium for hypokalemia, as well as a dose of Toradol upon recommendation from general surgery. Patient will be discharged home with pain and nausea medications as well as antibiotic Cipro. We discussed risk and side effects including possibly for tendon rupture. She reports tongue lesions/mouth lesions as a result of amoxicillin in the past.  CONSULTATIONS: Discussed face to face with Dr. Earlene Plateravis, general surgery - after Discussion and review of the patient's clinical history and in consultation with the patient, patient she did not have surgery now, Dr. Earlene Plateravis is recommending discharge home with treatment for UTI and symptomatic medications for biliary colic.    Patient / Family / Caregiver informed of clinical course, medical decision-making process, and agree with plan.   I discussed return precautions, follow-up instructions, and discharged instructions with patient and/or family.   Discharge instructions:  You were evaluated for abdominal pain and found to have evidence of gallstones as well as urinary tract infection. You're also found to have low potassium and given potassium supplement here in the emergency department. You're also found to have mild decreased kidney function, acute kidney injury, likely due to dehydration and you're given IV fluids here in the emergency department.  After discussion with Dr. Earlene Plateravis, general surgeon,  he recommends following up with him as an outpatient and lesser worsening of course.  You're being treated for urinary tract infection with antiemetic called Cipro.  Return to the emergency department immediately for any worsening or uncontrolled abdominal pain, weakness, numbness, black or bloody stools, vomiting and concern for dehydration, fever, or any other symptoms concerning to you. Please follow-up with a primary care doctor for evaluation  and follow-up of urinary tract infection as well as low potassium as well as dehydration.   ____________________________________________   FINAL CLINICAL IMPRESSION(S) / ED DIAGNOSES  Final diagnoses:  Vomiting  RUQ pain  Calculus of gallbladder with acute cholecystitis without obstruction  Urinary tract infection with hematuria, site unspecified  Hypokalemia  AKI (acute kidney injury) (HCC)        Governor Rooks, MD 02/04/17 [phone]

## 2017-02-04 NOTE — ED Notes (Signed)
Patient transported to Ultrasound 

## 2017-02-06 LAB — URINE CULTURE

## 2017-02-21 ENCOUNTER — Telehealth: Payer: Self-pay | Admitting: Surgery

## 2017-02-21 NOTE — Telephone Encounter (Signed)
I have called patient to make an appointment for ED Follow-up (02/04/17): Cholelithiasis. No answer. I have left a message on voicemail. Please add patient to any available surgeon.

## 2017-02-28 ENCOUNTER — Encounter: Payer: Self-pay | Admitting: Surgery

## 2017-02-28 NOTE — Telephone Encounter (Signed)
I have called patient again to make an appointment. No answer. I have not able to leave a message on voicemail. Voicemail was full.   I have mailed a letter. Letter is in the chart in EPIC.

## 2018-10-21 IMAGING — US US ABDOMEN LIMITED
1 series · 14 of 25 positions shown · non-contrast
Comparison: None.

CLINICAL DATA: Right upper quadrant pain.  Nausea and vomiting.

EXAM:
ULTRASOUND ABDOMEN LIMITED RIGHT UPPER QUADRANT

[Series 1: us abdomen limited · 0.22mm/px · 14 of 46 slices shown]
[im 1/46]
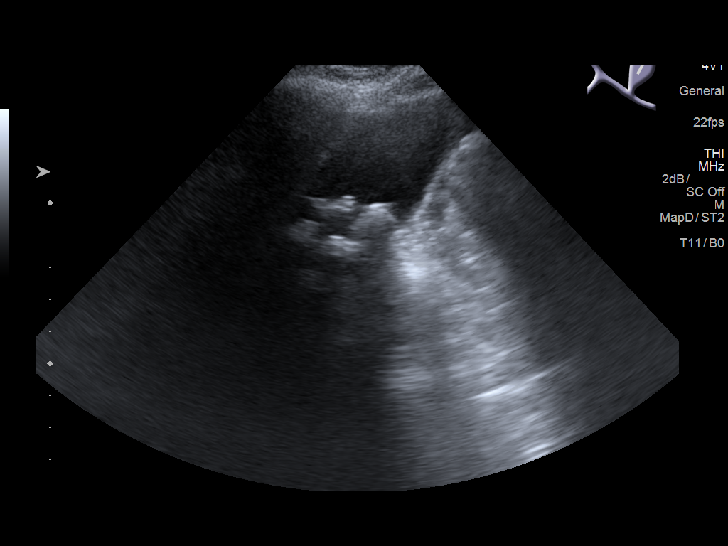
[im 4/46]
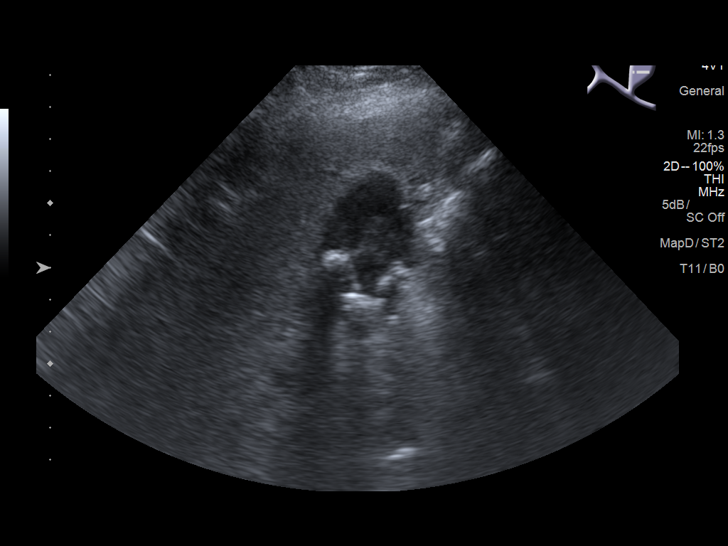
[im 8/46]
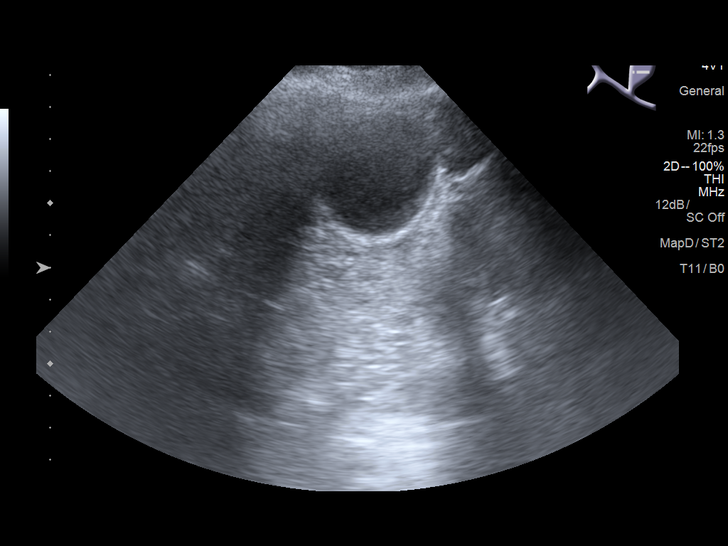
[im 12/46]
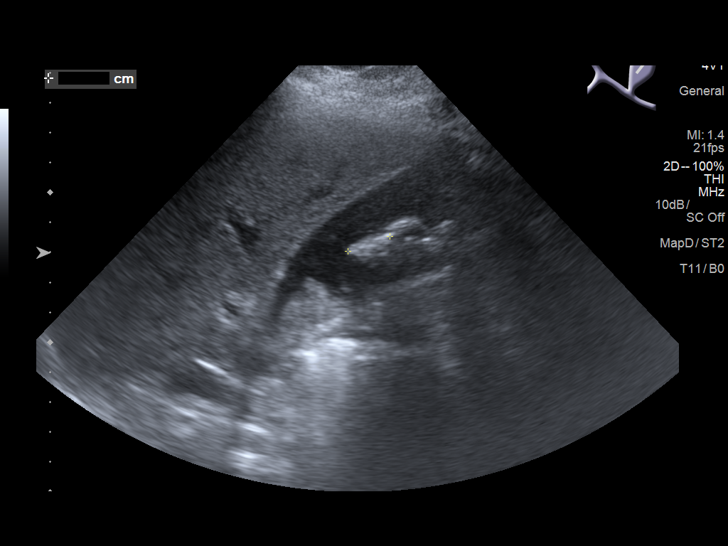
[im 16/46]
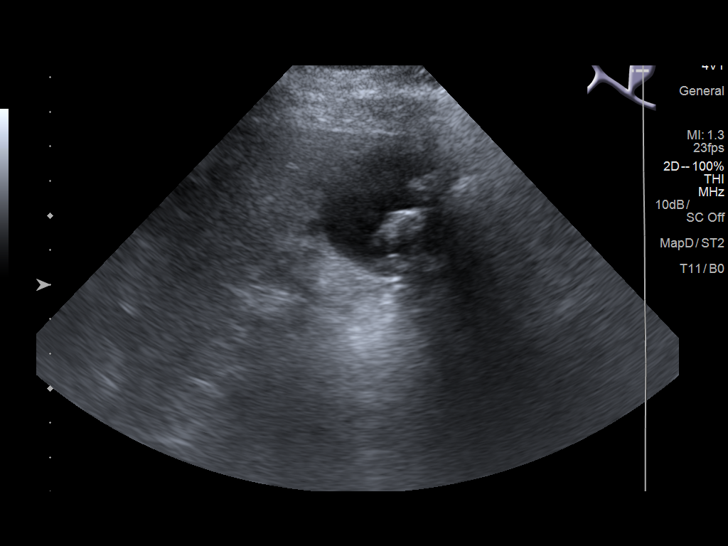
[im 17/46]
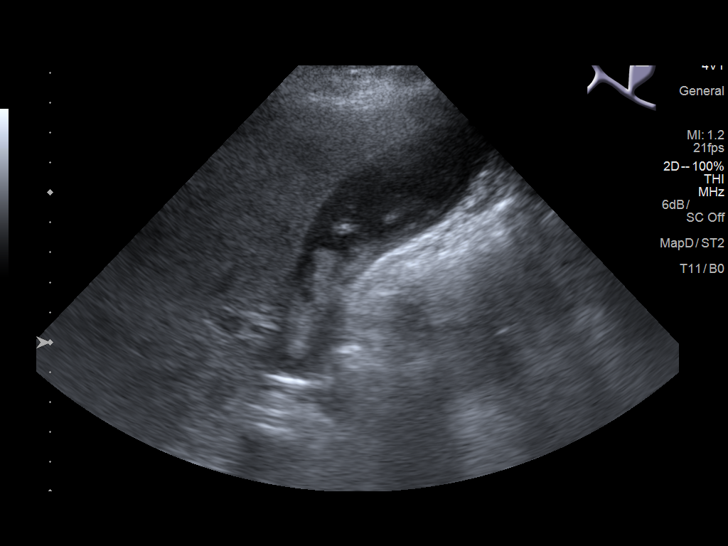
[im 21/46]
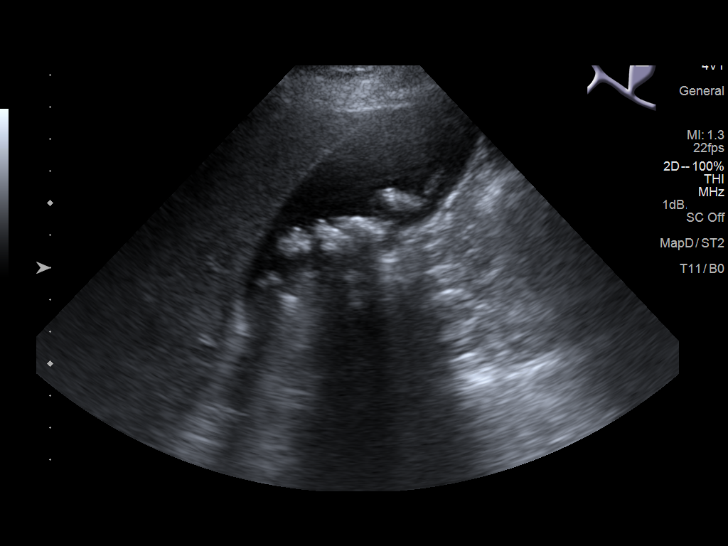
[im 25/46]
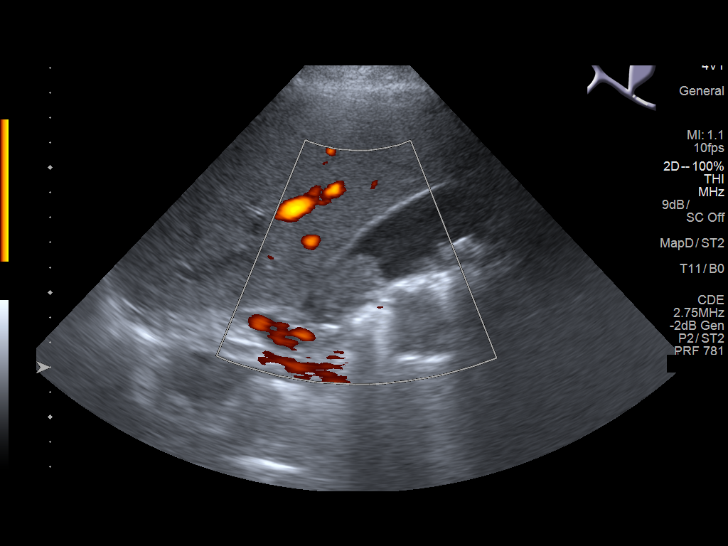
[im 29/46]
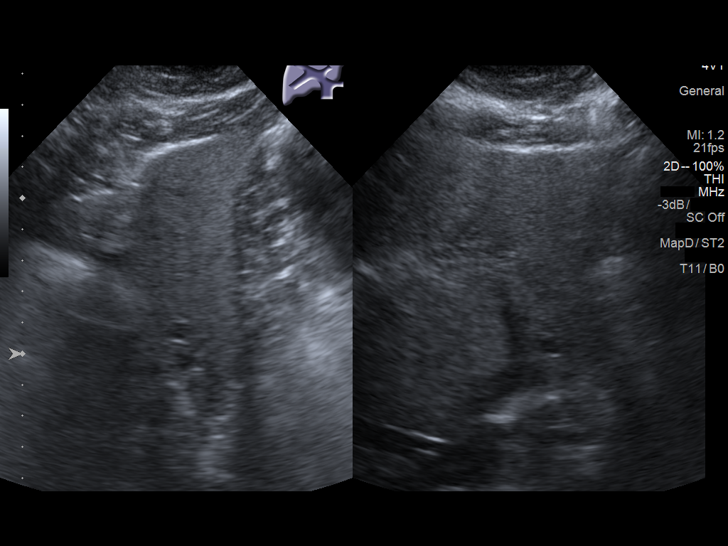
[im 31/46]
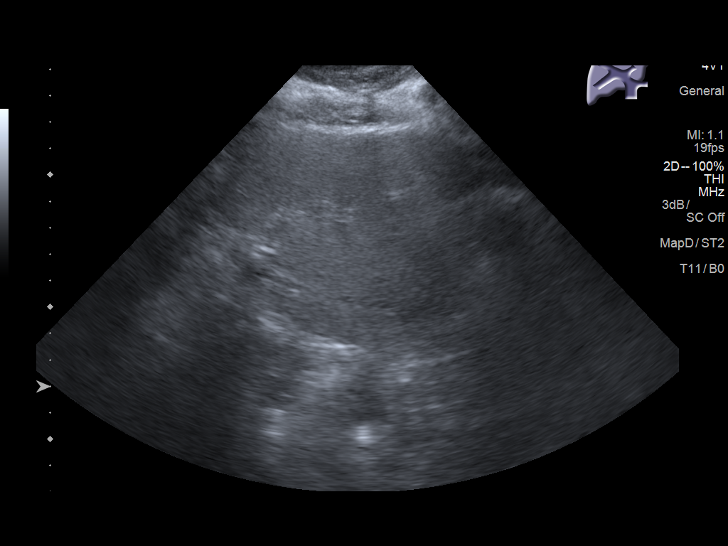
[im 34/46]
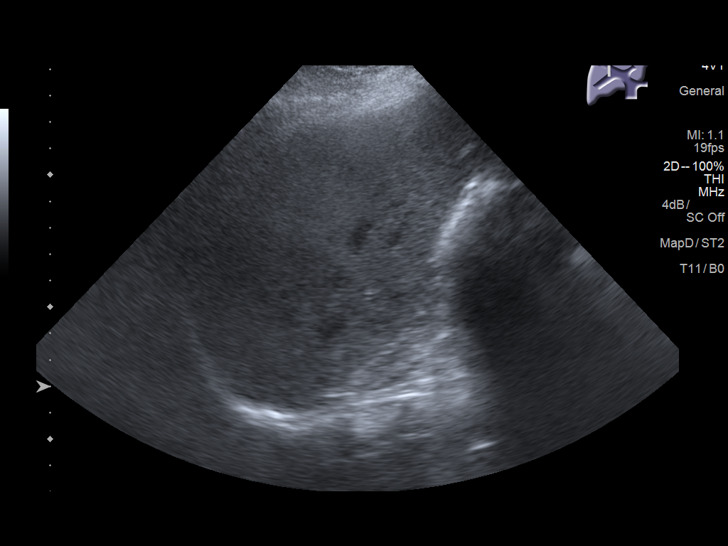
[im 38/46]
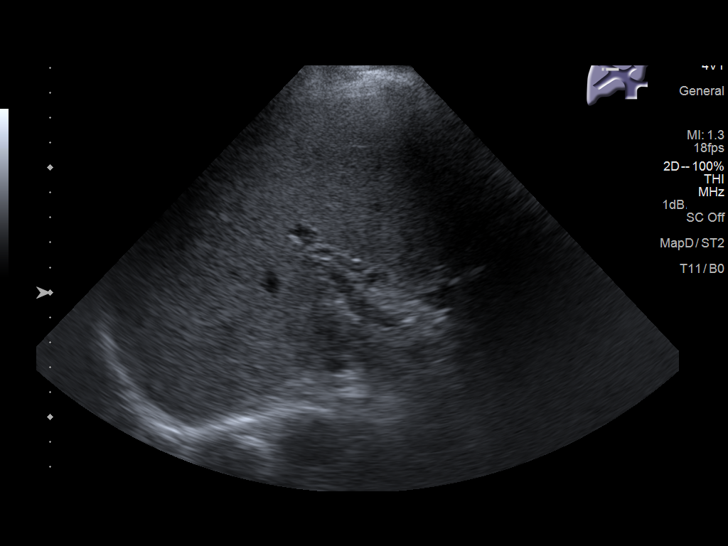
[im 42/46]
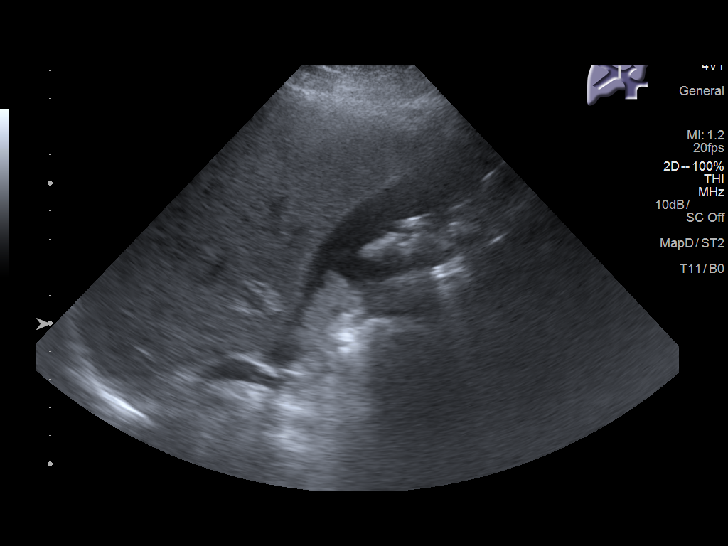
[im 46/46]
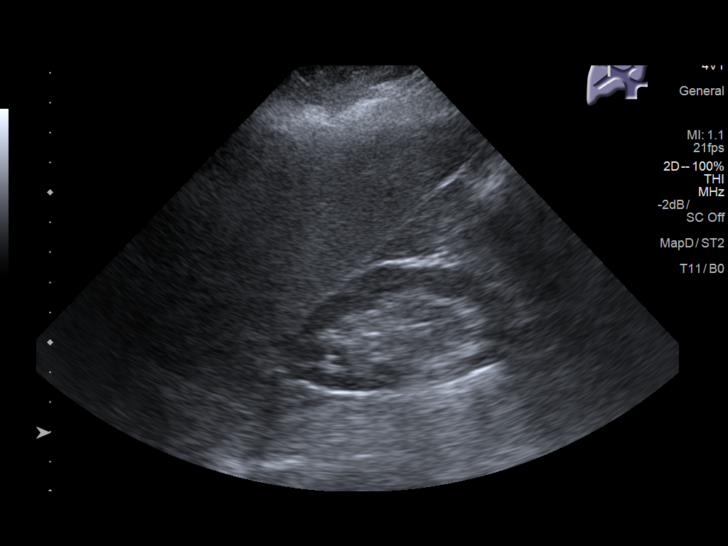

[14 of 25 positions shown; findings below may reference images not displayed]

FINDINGS: Gallbladder:

Distended containing intraluminal stones and tumefactive sludge.
Largest stone measures 1.5 cm. No gallbladder wall thickening, wall
thickness of less than 3 mm. No pericholecystic fluid. No
sonographic Murphy sign noted by sonographer.

Common bile duct:

Diameter: 9 mm.

Liver:

7 mm cyst in the left hepatic lobe. No suspicious hepatic lesion.
Mild heterogeneous hepatic echogenicity. Normal directional flow in
the main portal vein.
IMPRESSION: 1. Distended gallbladder with stones and tumefactive sludge. No wall
thickening or pericholecystic fluid to suggest acute cholecystitis.
2. Common bile duct is dilated measuring 9 mm. No
choledocholithiasis is seen sonographically. Consider MRCP or
nuclear medicine hepatobiliary scan (HIDA) for further evaluation.

## 2020-07-31 ENCOUNTER — Other Ambulatory Visit: Payer: Self-pay

## 2020-07-31 ENCOUNTER — Ambulatory Visit
Admission: EM | Admit: 2020-07-31 | Discharge: 2020-07-31 | Disposition: A | Discharge status: Home / Self Care | Payer: BC Managed Care – PPO | Attending: Family Medicine | Admitting: Family Medicine

## 2020-07-31 ENCOUNTER — Encounter: Payer: Self-pay | Admitting: Emergency Medicine

## 2020-07-31 DIAGNOSIS — Z20822 Contact with and (suspected) exposure to covid-19: Secondary | ICD-10-CM

## 2020-07-31 DIAGNOSIS — B349 Viral infection, unspecified: Secondary | ICD-10-CM | POA: Diagnosis present

## 2020-07-31 DIAGNOSIS — U071 COVID-19: Secondary | ICD-10-CM | POA: Diagnosis not present

## 2020-07-31 MED ORDER — KETOROLAC TROMETHAMINE 10 MG PO TABS: 10.0000 mg | ORAL_TABLET | Freq: Four times a day (QID) | ORAL | 0 refills | Status: AC | PRN

## 2020-07-31 NOTE — ED Triage Notes (Signed)
Patient states that family members in her house tested positive for COVID last week.  Patient c/o SOB and nasal congestion that started on Thursday.  Patient reports low grade fevers.

## 2020-07-31 NOTE — Discharge Instructions (Signed)
Medication as prescribed.  Stay home.  Check my chart for COVID test results.  Take care  Dr. Yago Ludvigsen   

## 2020-07-31 NOTE — ED Provider Notes (Signed)
[facility]    CSN: [id number] Arrival date & time: 07/31/20  1048      History   Chief Complaint Chief Complaint  Patient presents with   Covid Exposure   Shortness of Breath   Nasal Congestion   HPI  60 year old female presents with the above complaints.  Patient reports recent exposure to COVID-19.  She reports that she was previously tested and was negative.  However, she has now developed symptoms.  She reports her symptoms started on Wednesday.  She reports back pain, chest pain, shortness of breath.  She reports diffuse itching as well.  Of note, patient has chronic back pain for which she takes methadone.  She has taken Benadryl without relief.  No documented fever.  Temperature mildly elevated currently.  Desires testing today.  No other complaints.  Home Medications    Prior to Admission medications   Medication Sig Start Date End Date Taking? Authorizing Provider  cetirizine (ZYRTEC) 10 MG tablet Take 10 mg by mouth daily. 02/23/16 07/31/20 Yes [provider]  cyclobenzaprine (FLEXERIL) 5 MG tablet Take 1 tablet by mouth 4 (four) times daily as needed. 11/16/16  Yes [provider]  ketorolac (TORADOL) 10 MG tablet Take 1 tablet (10 mg total) by mouth every 6 (six) hours as needed for moderate pain or severe pain. 07/31/20  Yes Lem Peary G, DO  lansoprazole (PREVACID) 30 MG capsule Take 30 mg by mouth daily. 08/24/16  Yes [provider]  methadone (DOLOPHINE) 10 MG/5ML solution Take 63 mg by mouth daily.    Yes [provider]  Multiple Vitamin (MULTIVITAMIN) capsule Take 1 capsule by mouth daily.   Yes [provider]  ondansetron (ZOFRAN) 4 MG tablet Take 1 tablet (4 mg total) by mouth every 8 (eight) hours as needed for nausea or vomiting. 02/04/17   Governor Rooks, MD  potassium chloride (K-DUR) 10 MEQ tablet Take 10 mEq by mouth daily. 02/14/16 07/31/20  [provider]  torsemide (DEMADEX) 10 MG tablet  Take 10 mg by mouth daily as needed. 05/17/16 07/31/20  [provider]   Social History Social History   Tobacco Use   Smoking status: Never Smoker   Smokeless tobacco: Never Used  Vaping Use   Vaping Use: Never used     Allergies   Amoxicillin   Review of Systems Review of Systems Per HPI  Physical Exam Triage Vital Signs ED Triage Vitals  Enc Vitals Group     BP 07/31/20 1132 (!) 132/93     Pulse Rate 07/31/20 1132 (!) 109     Resp 07/31/20 1132 14     Temp 07/31/20 1132 99.4 F (37.4 C)     Temp Source 07/31/20 1132 Oral     SpO2 07/31/20 1132 97 %     Weight 07/31/20 1128 139 lb (63 kg)     Height 07/31/20 1128 5\' 6"  (1.676 m)     Head Circumference --      Peak Flow --      Pain Score 07/31/20 1128 6     Pain Loc --      Pain Edu? --      Excl. in GC? --    Updated Vital Signs BP (!) 132/93 (BP Location: Left Arm)    Pulse (!) 109    Temp 99.4 F (37.4 C) (Oral)    Resp 14    Ht 5\' 6"  (1.676 m)    Wt 63 kg  SpO2 97%    BMI 22.44 kg/m   Visual Acuity Right Eye Distance:   Left Eye Distance:   Bilateral Distance:    Right Eye Near:   Left Eye Near:    Bilateral Near:     Physical Exam Vitals and nursing note reviewed.  Constitutional:      General: She is not in acute distress.    Appearance: Normal appearance. She is not ill-appearing.  Eyes:     General:        Right eye: No discharge.        Left eye: No discharge.     Conjunctiva/sclera: Conjunctivae normal.  Cardiovascular:     Rate and Rhythm: Regular rhythm. Tachycardia present.  Pulmonary:     Effort: Pulmonary effort is normal.     Breath sounds: Normal breath sounds. No wheezing, rhonchi or rales.  Neurological:     Mental Status: She is alert.  Psychiatric:        Mood and Affect: Mood normal.        Behavior: Behavior normal.    UC Treatments / Results  Labs (all labs ordered are listed, but only abnormal results are displayed) Labs Reviewed  SARS CORONAVIRUS  2 (TAT 6-24 HRS)    EKG   Radiology No results found.  Procedures Procedures (including critical care time)  Medications Ordered in UC Medications - No data to display  Initial Impression / Assessment and Plan / UC Course  I have reviewed the triage vital signs and the nursing notes.  Pertinent labs & imaging results that were available during my care of the patient were reviewed by me and considered in my medical decision making (see chart for details).    59 year old female presents with a viral illness.  Concern for COVID-19.  Awaiting test results.  Toradol for body aches. Supportive care.  Final Clinical Impressions(s) / UC Diagnoses   Final diagnoses:  Viral illness  Close exposure to COVID-19 virus     Discharge Instructions     Medication as prescribed.  Stay home.  Check my chart for COVID test results.  Take care  Dr. Adriana Simas     ED Prescriptions    Medication Sig Dispense Auth. Provider   ketorolac (TORADOL) 10 MG tablet Take 1 tablet (10 mg total) by mouth every 6 (six) hours as needed for moderate pain or severe pain. 20 tablet Tommie Sams, DO     PDMP not reviewed this encounter.   Tommie Sams, [place] 07/31/20 1229

## 2020-08-01 LAB — SARS CORONAVIRUS 2 (TAT 6-24 HRS): SARS Coronavirus 2: POSITIVE — AB

## 2022-01-31 ENCOUNTER — Ambulatory Visit
Admission: EM | Admit: 2022-01-31 | Discharge: 2022-01-31 | Disposition: A | Discharge status: Home / Self Care | Payer: BC Managed Care – PPO | Attending: Physician Assistant | Admitting: Physician Assistant

## 2022-01-31 ENCOUNTER — Ambulatory Visit (INDEPENDENT_AMBULATORY_CARE_PROVIDER_SITE_OTHER): Payer: BC Managed Care – PPO

## 2022-01-31 DIAGNOSIS — M79674 Pain in right toe(s): Secondary | ICD-10-CM

## 2022-01-31 DIAGNOSIS — S90111A Contusion of right great toe without damage to nail, initial encounter: Secondary | ICD-10-CM | POA: Diagnosis not present

## 2022-01-31 NOTE — ED Provider Notes (Signed)
[facility]    CSN: [id number] Arrival date & time: 01/31/22  1602      History   Chief Complaint No chief complaint on file.   HPI Regina Hanson is a 61 y.o. female presenting for right great toe and second toe pain and bruising since last night.  Patient reports she heard her grandchildren upstairs and thought one of them fell so she ran up the stairs and her foot got caught on the stairs.  She has taken Tylenol for pain relief.  Her daughter wanted her to come in to rule out a fracture.  She reports it feels a little numb and tingly.  She reports that she has broken the second toe in the past.  She has no other injuries or complaints.  HPI  History reviewed. No pertinent past medical history.  Patient Active Problem List   Diagnosis Date Noted   Calculus of gallbladder without cholecystitis without obstruction 02/04/2017    History reviewed. No pertinent surgical history.  OB History   No obstetric history on file.      Home Medications    Prior to Admission medications   Medication Sig Start Date End Date Taking? Authorizing Provider  cetirizine (ZYRTEC) 10 MG tablet Take 10 mg by mouth daily. 02/23/16 07/31/20  [provider]  cyclobenzaprine (FLEXERIL) 5 MG tablet Take 1 tablet by mouth 4 (four) times daily as needed. 11/16/16   [provider]  ketorolac (TORADOL) 10 MG tablet Take 1 tablet (10 mg total) by mouth every 6 (six) hours as needed for moderate pain or severe pain. 07/31/20   Tommie Sams, DO  lansoprazole (PREVACID) 30 MG capsule Take 30 mg by mouth daily. 08/24/16   [provider]  methadone (DOLOPHINE) 10 MG/5ML solution Take 63 mg by mouth daily.     [provider]  Multiple Vitamin (MULTIVITAMIN) capsule Take 1 capsule by mouth daily.    [provider]  ondansetron (ZOFRAN) 4 MG tablet Take 1 tablet (4 mg total) by mouth every 8 (eight) hours as needed for nausea or vomiting. 02/04/17   Governor Rooks, MD  potassium chloride (K-DUR) 10 MEQ tablet Take 10 mEq by mouth daily. 02/14/16 07/31/20  [provider]  torsemide (DEMADEX) 10 MG tablet Take 10 mg by mouth daily as needed. 05/17/16 07/31/20  [provider]    Family History History reviewed. No pertinent family history.  Social History Social History   Tobacco Use   Smoking status: Never   Smokeless tobacco: Never  Vaping Use   Vaping Use: Never used  Substance Use Topics   Alcohol use: Never   Drug use: Never     Allergies   Amoxicillin   Review of Systems Review of Systems  Musculoskeletal:  Positive for arthralgias and joint swelling. Negative for gait problem.  Skin:  Positive for color change. Negative for wound.  Neurological:  Negative for weakness and numbness.     Physical Exam Triage Vital Signs ED Triage Vitals  Enc Vitals Group     BP 01/31/22 1613 105/77     Pulse Rate 01/31/22 1613 85     Resp --      Temp 01/31/22 1613 98.3 F (36.8 C)     Temp Source 01/31/22 1613 Oral     SpO2 01/31/22 1613 100 %     Weight 01/31/22 1611 130 lb (59 kg)     Height 01/31/22 1611 5\' 6"  (1.676 m)  Head Circumference --      Peak Flow --      Pain Score 01/31/22 1611 6     Pain Loc --      Pain Edu? --      Excl. in GC? --    No data found.  Updated Vital Signs BP 105/77 (BP Location: Left Arm)   Pulse 85   Temp 98.3 F (36.8 C) (Oral)   Ht 5\' 6"  (1.676 m)   Wt 130 lb (59 kg)   SpO2 100%   BMI 20.98 kg/m     Physical Exam Vitals and nursing note reviewed.  Constitutional:      General: She is not in acute distress.    Appearance: Normal appearance. She is not ill-appearing or toxic-appearing.  HENT:     Head: Normocephalic and atraumatic.  Eyes:     General: No scleral icterus.       Right eye: No discharge.        Left eye: No discharge.     Conjunctiva/sclera: Conjunctivae normal.  Cardiovascular:     Rate and Rhythm: Normal rate and regular rhythm.      Pulses: Normal pulses.  Pulmonary:     Effort: Pulmonary effort is normal. No respiratory distress.  Musculoskeletal:     Cervical back: Neck supple.     Comments: Right foot: There is moderate ecchymosis and swelling of the right great toe.  Tenderness to palpation of the proximal phalanx and DIP.  Tenderness at base of second digit.  Good pulses.  Skin:    General: Skin is dry.  Neurological:     General: No focal deficit present.     Mental Status: She is alert. Mental status is at baseline.     Motor: No weakness.     Gait: Gait normal.  Psychiatric:        Mood and Affect: Mood normal.        Behavior: Behavior normal.        Thought Content: Thought content normal.      UC Treatments / Results  Labs (all labs ordered are listed, but only abnormal results are displayed) Labs Reviewed - No data to display  EKG   Radiology DG Foot Complete Right  Result Date: 01/31/2022 CLINICAL DATA:  Patient fell going up stairs, erythema right great toe EXAM: RIGHT FOOT COMPLETE - 3+ VIEW COMPARISON:  None Available. FINDINGS: Frontal, oblique, and lateral views of the right foot are obtained. No acute fracture, subluxation, or dislocation. Mild midfoot osteoarthritis. Soft tissues are unremarkable. IMPRESSION: 1. Mild midfoot osteoarthritis. 2. No acute displaced fracture.  Unremarkable right great toe. Electronically Signed   By: 02/02/2022 M.D.   On: 01/31/2022 16:54    Procedures Procedures (including critical care time)  Medications Ordered in UC Medications - No data to display  Initial Impression / Assessment and Plan / UC Course  I have reviewed the triage vital signs and the nursing notes.  Pertinent labs & imaging results that were available during my care of the patient were reviewed by me and considered in my medical decision making (see chart for details).  61 year old female presenting for right great toe and second toe pain, bruising and swelling since yesterday  when she got her foot caught on her stairs.  An x-ray performed today shows mild midfoot arthritis but no acute fractures.  Discussed this with patient and her daughter.  Reviewed RICE guidelines and advised to continue with Tylenol for pain.  Reviewed return precautions.   Final Clinical Impressions(s) / UC Diagnoses   Final diagnoses:  Contusion of right great toe without damage to nail, initial encounter     Discharge Instructions      -No fractures. - Ice and elevate the extremity and continue with Tylenol for pain relief.  Should be feeling much better in the next couple days.  Return as needed.     ED Prescriptions   None    PDMP not reviewed this encounter.   Shirlee Latch, PA-C 01/31/22 1703

## 2022-01-31 NOTE — ED Triage Notes (Signed)
Patient reports that she thought she heard her grandchild fall so she ran up the stairs and caught her right foot big toe on the stairs.   This happened last night.

## 2022-01-31 NOTE — Discharge Instructions (Signed)
-  No fractures. - Ice and elevate the extremity and continue with Tylenol for pain relief.  Should be feeling much better in the next couple days.  Return as needed.
